# Patient Record
Sex: Female | Born: 1935 | Race: White | Hispanic: No | Smoking: Never smoker
Health system: Southern US, Community
[De-identification: ages and names within clinical notes are randomized; demographics above are authoritative.]

## PROBLEM LIST (undated history)

## (undated) DIAGNOSIS — I10 Essential (primary) hypertension: Secondary | ICD-10-CM

## (undated) DIAGNOSIS — I82409 Acute embolism and thrombosis of unspecified deep veins of unspecified lower extremity: Secondary | ICD-10-CM

---

## 2007-08-16 ENCOUNTER — Encounter: Admission: RE | Admit: 2007-08-16 | Discharge: 2007-08-16 | Payer: Self-pay | Admitting: Geriatric Medicine

## 2007-12-17 ENCOUNTER — Inpatient Hospital Stay (HOSPITAL_COMMUNITY): Admission: EM | Admit: 2007-12-17 | Discharge: 2007-12-24 | Payer: Self-pay | Admitting: Emergency Medicine

## 2008-01-09 ENCOUNTER — Ambulatory Visit: Admission: RE | Admit: 2008-01-09 | Discharge: 2008-01-09 | Payer: Self-pay | Admitting: Gynecology

## 2008-04-16 ENCOUNTER — Ambulatory Visit (HOSPITAL_COMMUNITY): Admission: RE | Admit: 2008-04-16 | Discharge: 2008-04-16 | Payer: Self-pay | Admitting: Gynecology

## 2008-04-28 ENCOUNTER — Encounter: Admission: RE | Admit: 2008-04-28 | Discharge: 2008-04-28 | Payer: Self-pay | Admitting: Geriatric Medicine

## 2008-05-09 ENCOUNTER — Encounter: Admission: RE | Admit: 2008-05-09 | Discharge: 2008-05-09 | Payer: Self-pay | Admitting: Geriatric Medicine

## 2008-10-17 ENCOUNTER — Encounter: Admission: RE | Admit: 2008-10-17 | Discharge: 2008-10-17 | Payer: Self-pay | Admitting: Geriatric Medicine

## 2008-10-21 ENCOUNTER — Encounter: Admission: RE | Admit: 2008-10-21 | Discharge: 2008-10-21 | Payer: Self-pay | Admitting: Geriatric Medicine

## 2008-10-29 ENCOUNTER — Encounter: Admission: RE | Admit: 2008-10-29 | Discharge: 2008-10-29 | Payer: Self-pay | Admitting: Geriatric Medicine

## 2008-12-22 ENCOUNTER — Encounter: Admission: RE | Admit: 2008-12-22 | Discharge: 2008-12-22 | Payer: Self-pay | Admitting: Geriatric Medicine

## 2009-03-18 ENCOUNTER — Encounter: Admission: RE | Admit: 2009-03-18 | Discharge: 2009-03-18 | Payer: Self-pay | Admitting: Geriatric Medicine

## 2009-04-29 ENCOUNTER — Encounter: Admission: RE | Admit: 2009-04-29 | Discharge: 2009-04-29 | Payer: Self-pay | Admitting: Geriatric Medicine

## 2009-05-07 IMAGING — CT CT PELVIS W/ CM
2 of 5 series · 16 of 46 positions shown, 18 images · IV contrast (APPLIED)
Comparison: none

CLINICAL DATA: Left lower quadrant pain and left groin pain.
 ABDOMEN CT WITH CONTRAST ? 12/17/07:
TECHNIQUE: Multidetector CT imaging of the abdomen was performed following the standard protocol during bolus administration of intravenous contrast. 
 Contrast: 100 cc Omnipaque 300 IV. 
 There is no prior study for comparison.
TECHNIQUE: Multidetector CT imaging of the pelvis was performed following the standard protocol during bolus administration of intravenous contrast.

[Series 2: abd/pelv with 5.0 b31f st · axial · 0.84mm/px · z∈[-296,+84]mm · 13 of 86 slices shown, 15 images]
[im 5/86  soft-tissue]
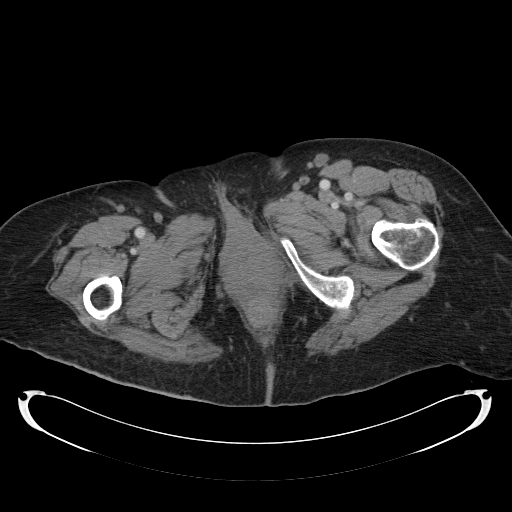
[im 5/86  bone]
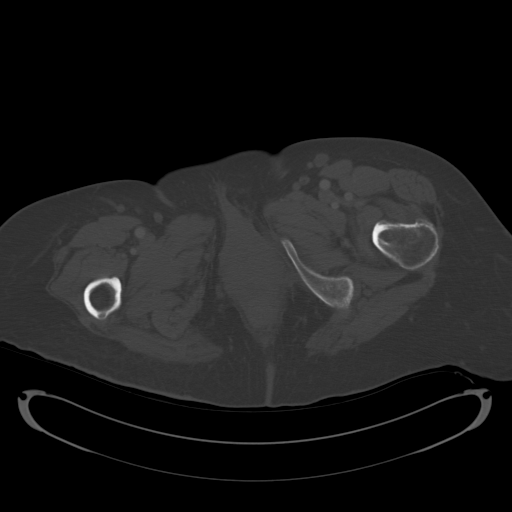
[im 14/86  soft-tissue]
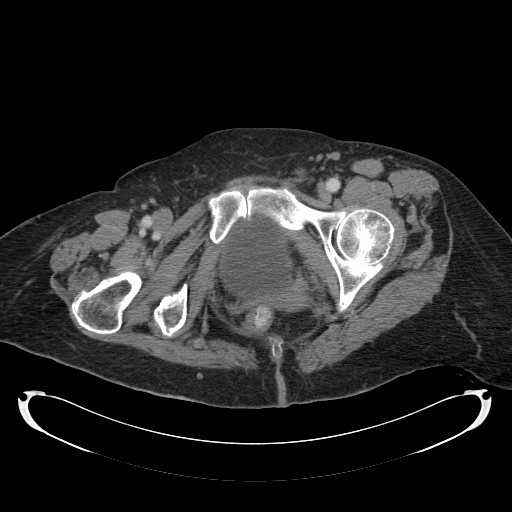
[im 18/86  soft-tissue]
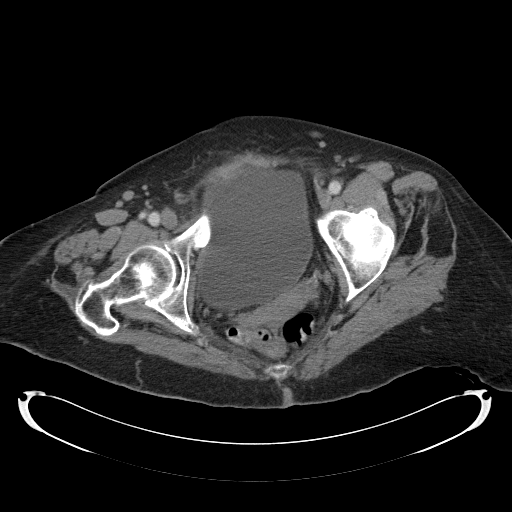
[im 23/86  soft-tissue]
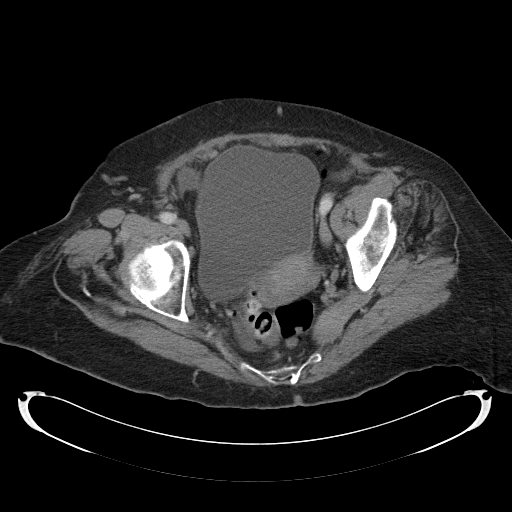
[im 32/86  soft-tissue]
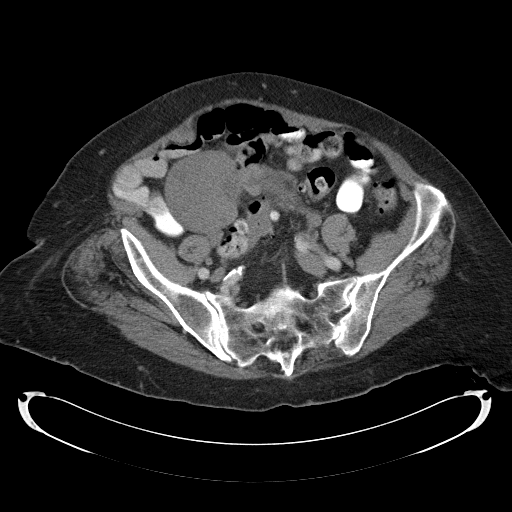
[im 36/86  soft-tissue]
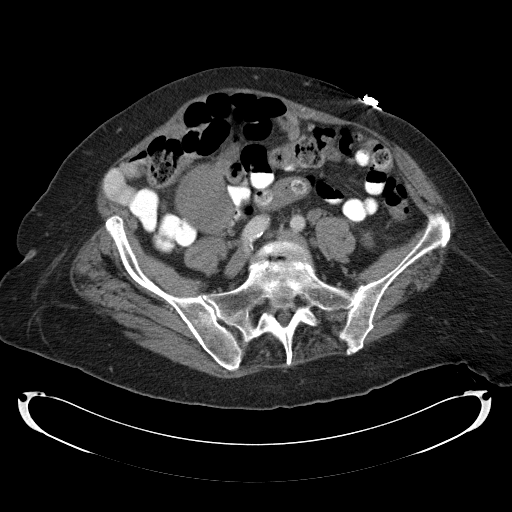
[im 45/86  soft-tissue]
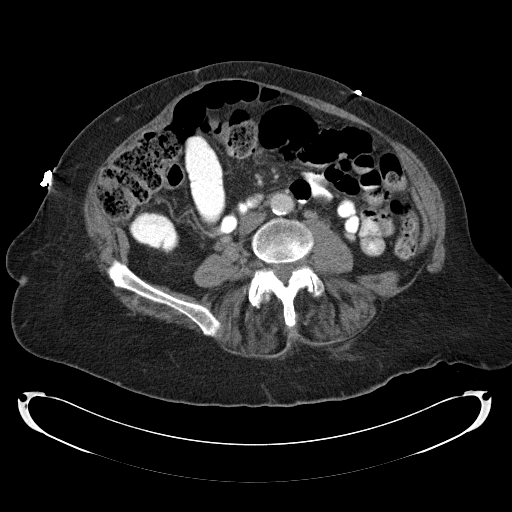
[im 50/86  soft-tissue]
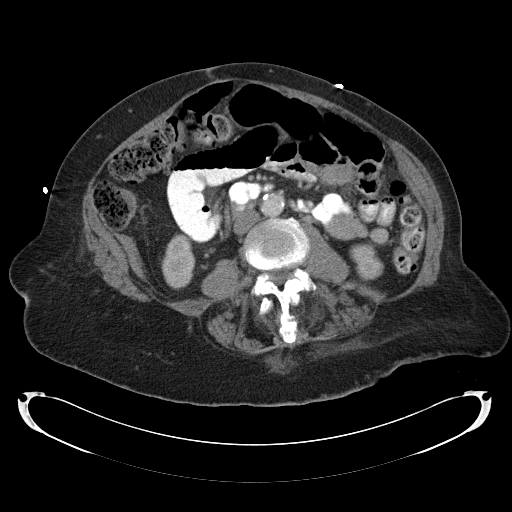
[im 54/86  soft-tissue]
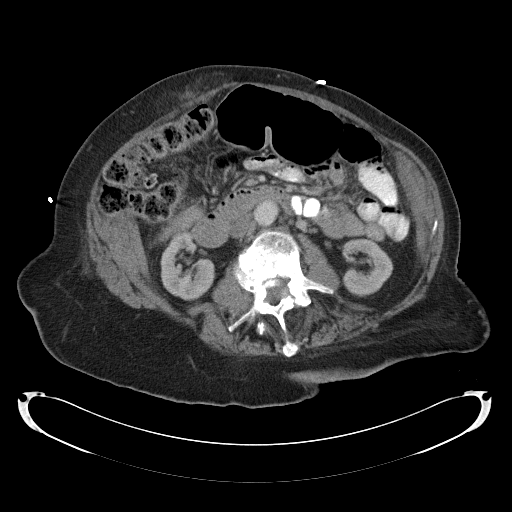
[im 54/86  bone]
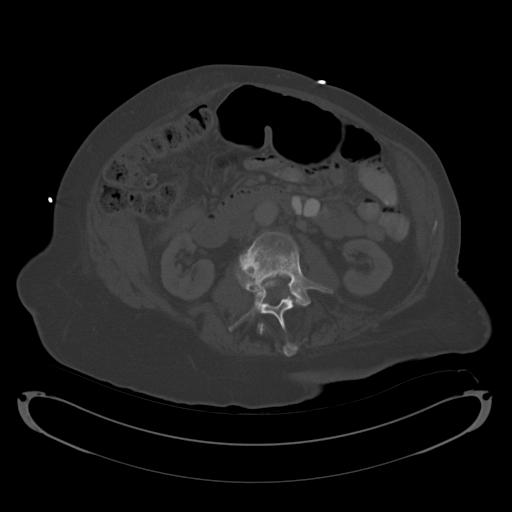
[im 63/86  soft-tissue]
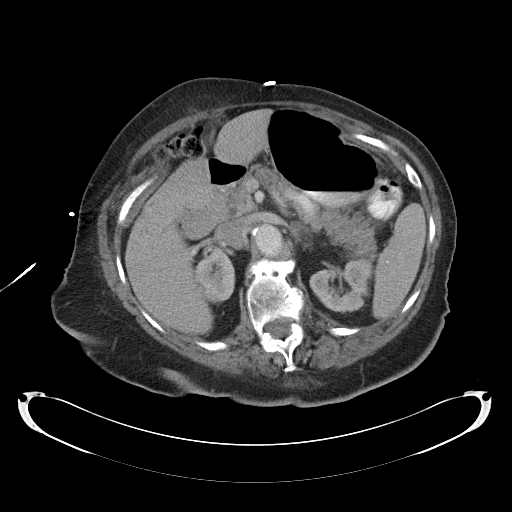
[im 68/86  soft-tissue]
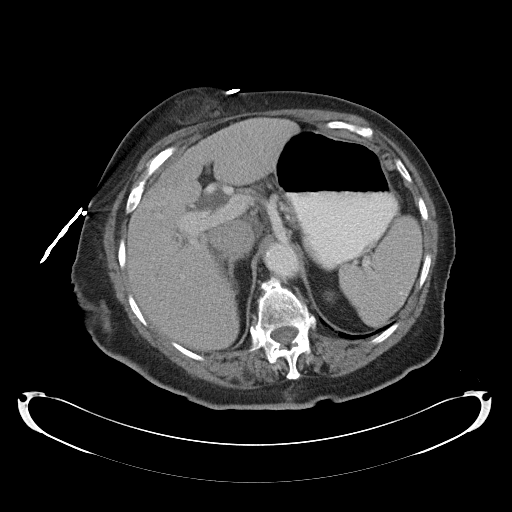
[im 72/86  soft-tissue]
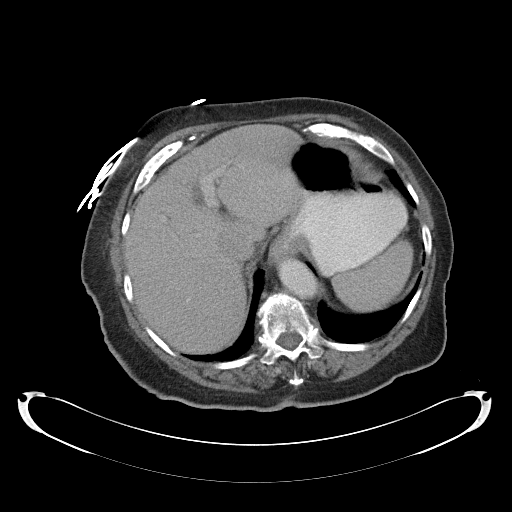
[im 81/86  soft-tissue]
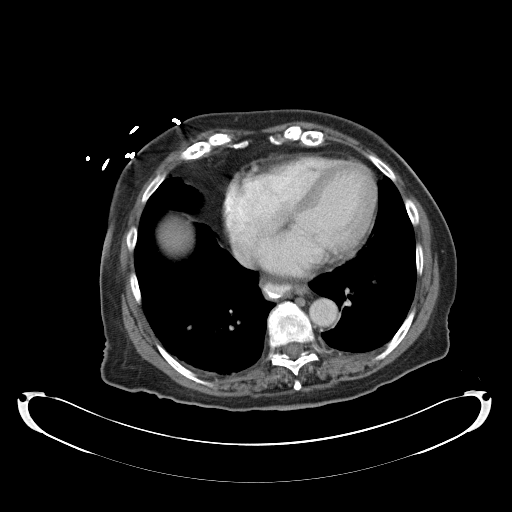

[Series 602: coronal · coronal · 0.84mm/px · 3 of 69 slices shown]
[im 23/69  soft-tissue]
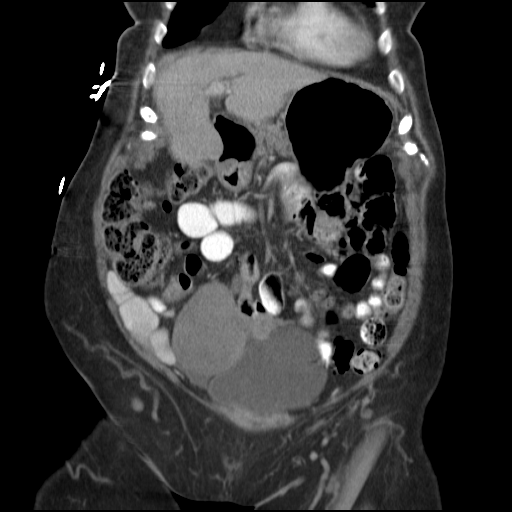
[im 31/69  soft-tissue]
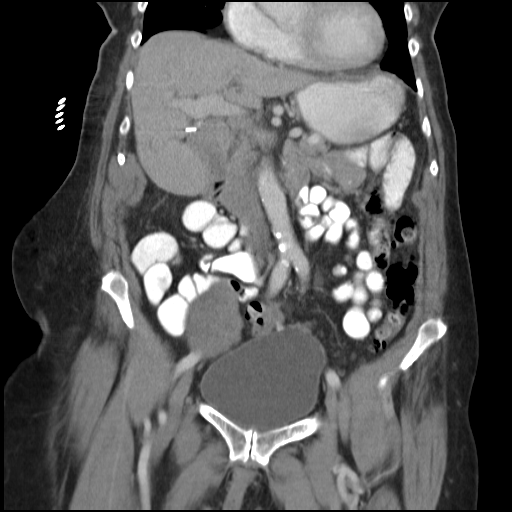
[im 38/69  soft-tissue]
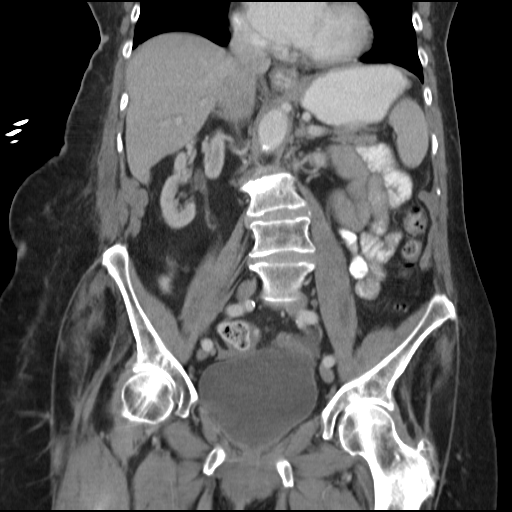

[16 of 46 positions shown; findings below may reference images not displayed]

FINDINGS: The visualized lung bases are unremarkable.  A small amount of right basilar atelectasis is noted.  A small hiatal hernia is noted.  
 The patient is status post cholecystectomy.  The enhanced liver and spleen are unremarkable.  There is a low-density cystic lesion in the head of the pancreas.  It measures 1.9 cm.  Although this may represent a pancreatic pseudocyst, a cystic neoplasm cannot be excluded.  Further evaluation is recommended.  
 Degenerative changes of the lumbar spine are seen.  The kidneys show symmetrical size and enhancement without evidence of focal mass.  There is no bowel obstruction.  There is no ascites or free air.  There is no adenopathy.  
 Mild intrahepatic biliary ductal dilatation is most likely a post-cholecystectomy state.  
 There is a right anterior small abdominal wall ventral hernia containing only fat measuring 4.7 x 1.8 cm without evidence of acute complication.  
 There is minimal posterior subluxation of L3 on L4 with moderate stenosis of the spinal canal at this level.
IMPRESSION: 1.  Status post cholecystectomy.
 2.  There is a low-density cystic lesion within the head of the pancreas measuring 1.9 cm.  Although it may represent a pancreatic pseudocyst, a cystic neoplasm cannot be excluded.  Further evaluation is recommended.
 3.  There is no bowel obstruction. There is no ascites or free air.
 4.  Degenerative changes of the lumbar spine are seen.
 PELVIS CT WITH CONTRAST ? 12/17/07:
FINDINGS: In the right anterior pelvis, there is a multilobulated mass which measures 7.5 x 7.7 cm.  Apparently this is arising from the right ovary.  It is highly suspicious for a neoplasm.  Further evaluation is recommended.  The urinary bladder is unremarkable.  
 The uterus is atrophic.  A trace amount of pelvic free fluid is noted. There is no pelvic adenopathy.  Multiple sigmoid colon diverticula are seen without evidence of acute diverticulitis.
IMPRESSION: 1.  There is a solid multilobulated mass apparently arising from the right ovary measuring 7.5 x 7.7 cm.  This is highly suspicious for a right ovarian neoplasm.  Further evaluation is recommended.  
 2.  There is a trace amount of pelvic free fluid.  Unremarkable urinary bladder.

## 2009-05-20 ENCOUNTER — Encounter: Admission: RE | Admit: 2009-05-20 | Discharge: 2009-05-20 | Payer: Self-pay | Admitting: Geriatric Medicine

## 2009-09-21 ENCOUNTER — Encounter: Admission: RE | Admit: 2009-09-21 | Discharge: 2009-09-21 | Payer: Self-pay | Admitting: Geriatric Medicine

## 2009-11-04 ENCOUNTER — Encounter: Admission: RE | Admit: 2009-11-04 | Discharge: 2009-11-04 | Payer: Self-pay | Admitting: Geriatric Medicine

## 2010-06-30 ENCOUNTER — Encounter: Admission: RE | Admit: 2010-06-30 | Discharge: 2010-06-30 | Payer: Self-pay | Admitting: Geriatric Medicine

## 2010-07-21 ENCOUNTER — Encounter: Admission: RE | Admit: 2010-07-21 | Discharge: 2010-07-21 | Payer: Self-pay | Admitting: Geriatric Medicine

## 2010-07-29 ENCOUNTER — Encounter: Admission: RE | Admit: 2010-07-29 | Discharge: 2010-07-29 | Payer: Self-pay | Admitting: Geriatric Medicine

## 2010-11-01 ENCOUNTER — Encounter: Payer: Self-pay | Admitting: Geriatric Medicine

## 2010-11-13 ENCOUNTER — Encounter: Payer: Self-pay | Admitting: Geriatric Medicine

## 2010-11-19 ENCOUNTER — Other Ambulatory Visit: Payer: Self-pay | Admitting: Geriatric Medicine

## 2010-11-19 DIAGNOSIS — Z1231 Encounter for screening mammogram for malignant neoplasm of breast: Secondary | ICD-10-CM

## 2010-12-07 ENCOUNTER — Ambulatory Visit
Admission: RE | Admit: 2010-12-07 | Discharge: 2010-12-07 | Disposition: A | Discharge status: Home / Self Care | Payer: Medicare Other | Source: Ambulatory Visit | Attending: Geriatric Medicine | Admitting: Geriatric Medicine

## 2010-12-07 DIAGNOSIS — Z1231 Encounter for screening mammogram for malignant neoplasm of breast: Secondary | ICD-10-CM

## 2011-02-22 NOTE — Consult Note (Signed)
NAMEMALYAH, OHLRICH                  ACCOUNT NO.:  [id number]   MEDICAL RECORD NO.:  [id number]          PATIENT TYPE:  INP   LOCATION:  5128                         FACILITY:  [facility]   PHYSICIAN:  Olene Craven, M.D.DATE OF BIRTH:  26-Feb-1923   DATE OF CONSULTATION:  DATE OF DISCHARGE:                                 CONSULTATION   REQUESTING PHYSICIAN:  Michiel Cowboy, M.D.   PRIMARY MD:  Hal T. Stoneking, M.D.   REASON FOR CONSULTATION:  Recommendations on pain control.   RECOMMENDATIONS:  1. Abdominal pain management:  Agree with the start of the morphine ER      50 mg p.o. b.i.d.  Additionally add morphine IR 50 mg p.o. q.4h.      p.r.n. for breakthrough pain.  Over the weekend, can use morphine      sulfate IV 1-2 mg q.1-2h. as needed.  We can adjust the medications      as needed as an outpatient as she returns to the skilled nursing      facility through a palliative care consults, if ordered.  2. Ovarian mass, likely causing her abdominal pain:  We will need      obvious workup for diagnosis.  OB/GYN outpatient referral has been      made.  We may need to have oncology if it is a cancerous lesion.      Once the diagnosis is made, disposition can better be made.  We can      be available for either continued palliative care consult versus      hospice if the patient's lesion turns out to be cancerous, and she      is to forgo treatment options.  3. Recommend at this time goals of care with the patient once better      understanding of her pelvic mass is diagnosed.   IMPRESSION:  1. Ovarian mass with a right 7.5 cm x 7.7 cm mass.  Outpatient workup      pending.  2. Pancreatic mass, likely pseudocyst.  Follow up as an outpatient.  3. Hypertension.   HISTORY OF PRESENT ILLNESS:  This is an 77 year old female who was  admitted earlier this week with a several-day history of lower abdominal  pain.  She was admitted and on further workup diagnosed a 7.5 x 7.7 cm  right ovarian mass.  She has an outpatient workup scheduled for this.  She was started on p.o. morphine after several days of IV morphine.  She  currently reports her pain as a level I.  She is denying any  constitutional symptoms at this time, any fevers or chills.  She has no  nausea or vomiting with this.  She has mild right lower quadrant  abdominal pain with deep palpation.  She denies any shortness of breath,  chest pain, dysphagia, any diarrhea, constipation, or vaginal discharge  with it.  No lower extremity edema with it.   REVIEW OF SYSTEMS:  Pertinent for right lower abdominal discomfort.  See  above for further review of systems.  Negative review of systems:  Negative constitutional.  No fevers or chills.  No dysphagia.  No  hearing or vision changes.  No cough.  No shortness of breath.  No chest  pain.  Mild right lower quadrant abdominal pain.  GU:  No dysuria.  No  vaginal discharge.  No nausea, vomiting, diarrhea.  No lower extremity  weakness.   PAST MEDICAL HISTORY:  Significant for hypertension.   SOCIAL HISTORY:  She is a widow with two children.  One passed away.  No  alcohol or drug use.   FAMILY HISTORY:  Negative for cancer.  Positive coronary artery disease.  She has no known drug allergies.   Her MAR was reviewed.   LABORATORY:  Patient's CT scan demonstrated a right ovarian mass, 7.5 cm  x 7.7 cm with minimal pelvic fluid.  Sodium today is 133, potassium 4.2,  glucose 100, BUN 11, chloride 102, bicarb 23.  Creatinine was 0.7.  She  had a hemoglobin of 11.2, platelets 307, white count 8.9.  Her CA-19-9  was 19.4.  CEA was 2.2.  CA-125 was 16.7.   PHYSICAL EXAMINATION:  Patient was sitting up in a chair with minimal  distress.  VITALS:  Stable.  Blood pressure 132/71, pulse 74.  HEENT:  Unremarkable.  Pupils are equal and reactive to light.  Extraocular movements are intact.  Oropharynx is clear without any  evidence of thrush.  NECK:  Supple.  No  JVD.  LUNGS:  Clear to auscultation.  HEART:  Regular rate and rhythm without murmur, rub or gallop.  ABDOMEN:  Slightly obese.  Soft.  Positive bowel sounds.  Positive right  lower quadrant abdominal discomfort with deep palpation.  Mild left  lower quadrant abdominal pain with discomfort as well.  GU/RECTAL:  Deferred.  No lower extremity clubbing, cyanosis or edema.   IMPRESSION/PLAN:  This is an 77 year old female who was admitted for  abdominal pain.  Under the workup, it was discovered she had a large  right ovarian mass.  At this time, an outpatient workup is to be  performed.  We have been consulted for recommendations and pain  management.  She was started on morphine ER last evening at 50 mg p.o.  b.i.d.  Today, she is reporting minimum discomfort.  This may be a good  dose to start with.  Would add MSIR 50 mg p.o. q.4h. p.r.n. for  breakthrough pain and monitor her as an outpatient and make changes and  adjustments as necessary.  Obviously, she will need a workup for her  ovarian mass.  In the event this is a cancerous lesion, oncology will  need to be consulted and further goals of care discussed in terms of a  workup.  We can be available for palliative care and hospice referrals  if indicated after the initial workup is done.   Thank you for the consult.  Approximately 90 minutes were spent with the  patient in terms of examination and coordination of care.      Olene Craven, M.D.  Electronically Signed     DEH/MEDQ  D:  12/21/2007  T:  12/21/2007  Job:  [id number]

## 2011-02-22 NOTE — Discharge Summary (Signed)
NAMEPAUL, Pam Wheeler                  ACCOUNT NO.:  [id number]   MEDICAL RECORD NO.:  [id number]          PATIENT TYPE:  INP   LOCATION:  5128                         FACILITY:  [facility]   PHYSICIAN:  Michiel Cowboy, MDDATE OF BIRTH:  Oct 14, 1922   DATE OF ADMISSION:  12/17/2007  DATE OF DISCHARGE:  12/22/2007                               DISCHARGE SUMMARY   DISCHARGE DIAGNOSES:  1. Pelvic mass per MRI, possibly fibroids.  2. Pancreatic cyst per MRI, likely benign.  3. Dementia.  4. Hypertension.  5. Hyponatremia.  6. Chronic back pain secondary to degenerative disk disease.  7. Left shoulder pain secondary to likely arthritis.  8. Constipation.  9. Rotator cuff impingement.   CONSULTATIONS:  1. Palliative care.  2. The case was discussed with OB/GYN.   FILMS:  CT of abdomen done on December 17, 2007 showing solid multilobulated  mass possibly arising from the right ovary, measuring 7.5 to 7.7 cm with  trace mild pelvic fluid.   KUB of the abdomen shows moderate stool in the colon without evidence of  obstruction.  MR of the abdomen done on December 19, 2007, reviewed by the  radiologist, a 1.6 cystic lesion of pancreatic head with communication  with common duct and pelvic mass which per radiology, seems similar  density to fibroids and uterus and likely represents a fibroid.  Also,  hiatal hernia noted.   Thoracic spine and lumbar spine films done on December 20, 2007 showing  diffuse spondylosis and right upper lobe with thoracic and lumbar  scoliosis.  No fractures noted.   A shoulder film done on December 21, 2007 showing degenerative joint  disease, worrisome for rotator cuff impingement.   HOSPITAL COURSE:  Please see full summary, but briefly, this is an 77-  year-old female with a history of hypertension, who presented with  diffuse pain and abdominal pain.  X-ray obtained showed a soft mass.  She had a follow-up CT scan of the abdomen that actually showed a  possible  pelvic mass worrisome for malignancy.  An MRI of the abdomen  was performed which per review of radiology showed more consistent with  possible fibroid rather than malignancy.  Patient also noted to have  pancreatic cyst, which was also felt to be likely benign.  The patient  has had cancer markers performed while in-house, all of which were  unremarkable, except for slightly elevated beta HCG of 5.  Her cancer  antigen 125 was 16.7.  Her cancer antigen 19.9 was 19.4.  Her CEA was  2.2.   Her case was discussed with OB/GYN, and she is to follow up with  oncology on April 1 at 11:30 a.m. for further evaluation but most  likely, her disease is benign.  This was discussed with family.  The  only definitive way to approach this is likely an intraoperative biopsy,  which the patient's family are against at this point, given the history  of dementia and advanced age.   Hyponatremia:  The patient was noted to be somewhat hyponatremic on  admission.  The patient was  given IV fluids, which improved  hyponatremia.  Prior to discharge, her sodium is up to 133.  She is  asymptomatic.  This was most likely secondary to dehydration secondary  to poor p.o. intake.  The patient is encouraged to take appropriate p.o.  when she is discharged.  Patient is to be discharged to [facility]  under the care of Dr. Pete Glatter.  Patient is to follow up with OB/GYN  for intra-abdominal/pelvic mass.  Patient is to have repeat imaging of  her pancreas in about six months.  MRI would probably be the most  appropriate at that time.   DISCHARGE MEDICATIONS:  1. Colace 100 mg p.o. b.i.d.  2. Avapro 150 mg p.o. daily.  3. Lopressor 25 mg p.o. b.i.d.  4. MS Contin 50 mg p.o. b.i.d. for pain but make it scheduled.  5. Multivitamins.  6. Ditropan 5 mg p.o. daily.  7. MiraLax 17 gm p.o. b.i.d. as needed for constipation.  8. Senna 1 tablet p.o. nightly.  9. Zocor 20 mg p.o. daily.  10.Morphine sulfate 50 mg p.o.  q.4h. p.r.n. breakthrough pain.   In relation to back pain, lumbar films were obtained that showed no  fractures or metastatic disease.  Palliative care consult was called.  Patient was started on MS Contin, given longstanding history of pain.  This can be further adjusted as an outpatient at the nursing facility.  Patient can also have a palliative care followup at the nursing facility  as well for continuation of pain management there.   Patient is to be discharged on MS Contin twice daily, 50 mg, as well as  p.r.n.  Morphine sulfate q.4h. as needed.  Patient is to have this held  for sedation.   Constipation:  Patient was started on an aggressive bowel regimen here.  Will continue that in the nursing facility.   Hypertension:  Continued home meds while in-house.   Left shoulder pain:  A shoulder film showed questionable impingement of  rotator cuff.  Patient is to have physical therapy follow up as an  outpatient with nursing facility.  The patient is to have joint  exercises and will likely benefit from further rehabilitation but  orthopedics as an outpatient in the future.  Please arrange for that as  an outpatient.   FOLLOW-UP LABS:  Patient will likely need to have repeat sodium level  once she is discharged to her nursing facility to make sure that her  hyponatremia has completely resolved, and the patient is doing well on  longstanding p.o. intake only and does not end up being dehydrated  again.  Would encourage p.o. fluids as an outpatient.     Michiel Cowboy, MD  Electronically Signed    AVD/MEDQ  D:  12/21/2007  T:  12/21/2007  Job:  [id number]   cc:   Hal T. Stoneking, M.D.

## 2011-02-22 NOTE — H&P (Signed)
NAMEMABELLE, Pam Wheeler                  ACCOUNT NO.:  [id number]   MEDICAL RECORD NO.:  [id number]          PATIENT TYPE:  EMS   LOCATION:  MAJO                         FACILITY:  [facility]   PHYSICIAN:  Hollice Espy, M.D.DATE OF BIRTH:  09/02/1923   DATE OF ADMISSION:  12/17/2007  DATE OF DISCHARGE:                              HISTORY & PHYSICAL   This is a continuation.  Please see previous partial H&P that was  dictated but, for some reason, was cut off.  Continuation is as follows.   ALLERGIES:  No known drug allergies.   SOCIAL HISTORY:  She denies any tobacco, alcohol or drug use.   FAMILY HISTORY:  Positive for CAD, negative for cancer.   PHYSICAL EXAMINATION:  VITAL SIGNS:  Temperature 97.4, heart rate 67,  blood pressure 164/86, respirations 18, O2 saturation 97% on room air.  GENERAL APPEARANCE:  She is alert and oriented x3, no apparent distress.  HEENT:  Normocephalic and atraumatic.  Moist mucous membranes.  NECK:  No carotid bruits.  LUNGS:  Clear to auscultation bilaterally.  CARDIOVASCULAR:  Regular rate and rhythm, S1 and S2.  ABDOMEN:  Soft with some generalized abdominal soreness over nonspecific  tenderness.  Decreased bowel sounds.  EXTREMITIES:  No clubbing or cyanosis.  She has about a 1+ pitting edema  from the knees down.   LABORATORY DATA:  Abdominal CT is as per HPI.   White count 7.3, hemoglobin 11.5, hematocrit 34, MCV 86, platelet count  307, no shift.  Sodium 139, potassium 3.9, chloride 97, bicarb 25, BUN  9, creatinine 0.79, glucose 94.  LFTs are notable for albumin of 3.1.  UA is negative.   ASSESSMENT/PLAN:  1. Abdominal pain with ovarian mass, possible cancer, seen on CT.      Will ask interventional radiology for biopsy and order routine labs      and evaluation of ovarian cancer including CA-125, CEA, LDH, alpha      fetoprotein,      inhibin and Beta HCG.  2. Hypertension.  Continue medications.  3. Hyponatremia.  Based on records,  patient does have a previous      medical history.  Will continue to observe.      Hollice Espy, M.D.  Electronically Signed     SKK/MEDQ  D:  12/17/2007  T:  12/17/2007  Job:  [id number]   cc:   Hal T. Stoneking, M.D.

## 2011-02-22 NOTE — H&P (Signed)
NAMEVALERIA, Wheeler                  ACCOUNT NO.:  [id number]   MEDICAL RECORD NO.:  [id number]          PATIENT TYPE:   LOCATION:                                 FACILITY:   PHYSICIAN:  Hollice Espy, M.D.DATE OF BIRTH:  [birth year]   DATE OF ADMISSION:  12/17/2007  DATE OF DISCHARGE:                              HISTORY & PHYSICAL   PRIMARY CARE PHYSICIAN:  Hal T. Stoneking, M.D.   CHIEF COMPLAINT:  Abdominal pain.   HISTORY OF PRESENT ILLNESS:  Please note that the patient is a very poor  historian and seems to be easily upset about her diagnosis possibility,  so she is not able to give me much of a Review of Systems and History.  Through medical records as well as documentation, the patient is an 77-  year-old white female with past medical history of hypertension who  started having abdominal pain in her left lower quadrant for at least  the past 1-2 days.  She has been still able to eat and has continued to  have regular bowel movements.  She says she has not had much of an  appetite, and she has had continued abdominal pain that has been not  very well treated with pain medication.  She was sent over from [facility] where upon evaluation she had normal labs.  However, abdominal and  pelvic CT showed evidence of a right ovarian mass concerning for cancer.  [facility] were called for further assessment and evaluation.  The patient herself appears to be quite upset.  She, in general,  actually denies basically any other complaints.  She denies any  headaches, vision changes.  No dysphagia, no chest pain, palpitations,  shortness of breath.  When asked about her abdominal pain, she does  complain of some she says soreness but only when prompted.  Her Review  of Systems is otherwise negative.   PAST MEDICAL HISTORY:  Includes hypertension.   MEDICATIONS:  1. Mevacor 40.  2. Micardis 40.  3. Ditropan XL 5.  4. Lopressor 25 p.o. b.i.d.  5. Multivitamin p.o.  daily.  6. Os-Cal with D 600 b.i.d.  7. Actonel 35 p.o. weekly.  8. Vicodin p.r.n.  9. Tylenol p.r.n.   DICTATION STOPS HERE.      Hollice Espy, M.D.  Electronically Signed    SKK/MEDQ  D:  12/17/2007  T:  12/17/2007  Job:  [id number]

## 2011-02-22 NOTE — Consult Note (Signed)
NAME:  Pam Wheeler, Pam Wheeler                  ACCOUNT NO.:  [id number]   MEDICAL RECORD NO.:  [id number]          PATIENT TYPE:  OUT   LOCATION:  GYN                          FACILITY:  [facility]   PHYSICIAN:  De Blanch, M.D.DATE OF BIRTH:  [birth year]   DATE OF CONSULTATION:  01/09/2008  DATE OF DISCHARGE:                                 CONSULTATION   CHIEF COMPLAINT:  An 77 year old white female seen in consultation by  Dr. Pete Glatter regarding management of a solid adnexal mass.  This mass  was apparently diagnosed during a recent hospitalization when the  patient presented with diffuse abdominal pain.  As part of that workup,  an MRI was obtained of the pelvis showing a 7.5 x 7.7 cm solid mass  arising from the right ovary.  There was no evidence of pelvic  adenopathy.  She does have multiple sigmoid colon diverticula, but no  evidence of acute diverticulitis.  There is trace amount of free pelvic  fluid.  Tumor markers have been obtained.  The CA-125 is 16, CA19-9 is  19.4 and CEA is 2.2.  Her inhibin A inhibin is 6.1 (elevated).  The  patient herself reports that she has no symptoms at the present time.  Her abdominal pain has resolved.   CURRENT MEDICATIONS:  1. Colace.  2. Avapro.  3. Lopressor.  4. MS Contin 50 mg b.i.d.  5. Multivitamins.  6. Ditropan.  7. MiraLax.  8. Senna.  9. Zocor.  10.Morphine sulfate p.r.n. breakthrough pain.   PAST MEDICAL HISTORY/ILLNESSES:  1. Dementia.  2. Hypertension.  3. Hyponatremia.  4. Chronic back pain due to degenerative disk.  5. Left shoulder pain secondary to osteoarthritis.  6. Constipation.  7. Rotator cuff impingement.   FAMILY HISTORY:  Negative for gynecologic, breast or colon cancer.   REVIEW OF SYSTEMS:  A 10-point comprehensive review of systems negative  except as noted above.   SOCIAL HISTORY:  The patient lives at the [facility].   PHYSICAL EXAMINATION:  VITAL SIGNS:  Height 5 feet five inches.  The  patient  is unable to stand to be weighed.  GENERAL:  The patient is an elderly white female who has difficulty  getting on and off the table and is short of breath.  ABDOMEN:  Soft, nontender.  No mass, organomegaly, ascites or hernias  noted.  PELVIC:  EGBUS, vagina, bladder and urethra are normal.  Cervix is  atrophic.  Uterus is difficult to outline because of the patient's  habitus.  No ascites or masses are noted.   IMPRESSION:  Solid pelvic mass with slightly elevated inhibin, all of  the tumor markers are negative.  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  Given her overall poor medical  condition, I believe she is not a surgical candidate.  Therefore would  recommend following this mass as granulosa cell tumors are oftentimes  slowly growing.  Additionally in the differential diagnosis,  fibrothecomas and other fibroid tumors of the ovary are possibilities  which would be even more slowly growing and given the  patient's lack of  symptoms would be observed.  We would suggest she have a repeat  ultrasound of the mass in approximately 3 months.      De Blanch, M.D.  Electronically Signed     DC/MEDQ  D:  01/09/2008  T:  01/09/2008  Job:  [id number]   cc:   Hal T. Stoneking, M.D.  Fax: [phone]   Telford Nab, R.N.  [address]  [place], [place] [address]

## 2011-02-25 NOTE — Discharge Summary (Signed)
Pam Wheeler, Pam Wheeler                  ACCOUNT NO.:  [id number]   MEDICAL RECORD NO.:  [id number]          PATIENT TYPE:  INP   LOCATION:  5128                         FACILITY:  [facility]   PHYSICIAN:  Michiel Cowboy, MDDATE OF BIRTH:  1922-10-18   DATE OF ADMISSION:  12/17/2007  DATE OF DISCHARGE:  12/24/2007                               DISCHARGE SUMMARY   ADDENDUM   Please see entire discharge summary from December 22, 2007.   The patient had to stay an additional 2 days secondary to hyponatremia.  The patient initially was given IV fluids and then was fluid  resuscitated.  The patient continued to have hyponatremia.  Once she was  slowed up, effort was made, but she has SIADH and she was actually given  fluid restriction.  Sodium continued to fluctuate and as per discussion  with Dr. Pete Glatter, who is her primary care Ramon Brant, he thought  comfortable to follow this up as an outpatient at the nursing facility.  At the time of discharge, her sodium was 127 and she was asymptomatic  from this.  Of note, her TSH was checked while in the hospital and it  was 4.566.  Her cortisol level was checked and that was 9.3.  The  patient was discharged back to a nursing facility  under Dr. Laverle Hobby  care on December 24, 2007.  Please see the prior discharge summary for list  of her medications.      Michiel Cowboy, MD  Electronically Signed     AVD/MEDQ  D:  01/22/2008  T:  01/22/2008  Job:  [id number]   cc:   Hal T. Stoneking, M.D.

## 2011-07-04 LAB — CBC
HCT: 32.3 — ABNORMAL LOW
HCT: 34.4 — ABNORMAL LOW
Hemoglobin: 10.8 — ABNORMAL LOW
Hemoglobin: 11 — ABNORMAL LOW
Hemoglobin: 11.2 — ABNORMAL LOW
Hemoglobin: 11.8 — ABNORMAL LOW
MCHC: 33.7
MCHC: 34.1
MCHC: 34.2
MCV: 86.4
MCV: 87.1
MCV: 87.4
MCV: 86.6
Platelets: 307
Platelets: 354
Platelets: 313
RBC: 3.67 — ABNORMAL LOW
RBC: 3.73 — ABNORMAL LOW
RBC: 3.82 — ABNORMAL LOW
RBC: 4.46
RDW: 12.7
RDW: 13
RDW: 12.6
RDW: 13
WBC: 10.8 — ABNORMAL HIGH
WBC: 7.8
WBC: 8.9

## 2011-07-04 LAB — COMPREHENSIVE METABOLIC PANEL
ALT: 13
ALT: 15
AST: 17
AST: 22
Alkaline Phosphatase: 45
CO2: 25
CO2: 25
Calcium: 8.4
Chloride: 99
Creatinine, Ser: 0.79
GFR calc Af Amer: >60
GFR calc Af Amer: >60
GFR calc non Af Amer: 56 — ABNORMAL LOW
GFR calc non Af Amer: >60
Potassium: 4.3
Sodium: 128 — ABNORMAL LOW
Sodium: 129 — ABNORMAL LOW
Total Bilirubin: 0.6
Total Protein: 6

## 2011-07-04 LAB — BASIC METABOLIC PANEL
BUN: 11
BUN: 20
CO2: 20
CO2: 24
CO2: 24
Calcium: 8.2 — ABNORMAL LOW
Chloride: 100
Chloride: 99
Creatinine, Ser: 0.72
Creatinine, Ser: 0.88
Creatinine, Ser: 0.96
GFR calc Af Amer: >60
GFR calc Af Amer: >60
GFR calc non Af Amer: 56 — ABNORMAL LOW
GFR calc non Af Amer: 56 — ABNORMAL LOW
Glucose, Bld: 103 — ABNORMAL HIGH
Glucose, Bld: 89
Glucose, Bld: 113 — ABNORMAL HIGH
Glucose, Bld: 96
Potassium: 4.2
Potassium: 4.1
Potassium: 4.6
Sodium: 131 — ABNORMAL LOW
Sodium: 128 — ABNORMAL LOW

## 2011-07-04 LAB — DIFFERENTIAL
Eosinophils Absolute: 0.2
Eosinophils Relative: 3
Lymphocytes Relative: 13
Lymphs Abs: 1
Monocytes Relative: 8

## 2011-07-04 LAB — LACTATE DEHYDROGENASE: LDH: 168

## 2011-07-04 LAB — URINALYSIS, ROUTINE W REFLEX MICROSCOPIC
Bilirubin Urine: NEGATIVE
Hgb urine dipstick: NEGATIVE
Ketones, ur: NEGATIVE
Nitrite: NEGATIVE
Protein, ur: NEGATIVE
Specific Gravity, Urine: 1.005
Urobilinogen, UA: 0.2

## 2011-07-04 LAB — BASIC METABOLIC PANEL WITH GFR
BUN: 11
CO2: 23
Calcium: 8.1 — ABNORMAL LOW
Chloride: 102
Creatinine, Ser: 0.71
GFR calc non Af Amer: >60
Glucose, Bld: 100 — ABNORMAL HIGH
Potassium: 4.2
Sodium: 133 — ABNORMAL LOW

## 2011-07-04 LAB — OSMOLALITY, URINE: Osmolality, Ur: 229 — ABNORMAL LOW

## 2011-07-04 LAB — SODIUM, URINE, RANDOM: Sodium, Ur: 53

## 2011-07-04 LAB — CANCER ANTIGEN 19-9: CA 19-9: 19.4 — ABNORMAL LOW (ref <35.0)

## 2011-07-04 LAB — CA 125: CA 125: 16.7

## 2011-07-04 LAB — CEA: CEA: 2.2

## 2011-07-04 LAB — HCG, QUANTITATIVE, PREGNANCY: hCG, Beta Chain, Quant, S: 5 — ABNORMAL HIGH

## 2015-09-09 ENCOUNTER — Inpatient Hospital Stay (HOSPITAL_COMMUNITY)
Admission: EM | Admit: 2015-09-09 | Discharge: 2015-09-11 | DRG: 871 | Disposition: A | Discharge status: Skilled Nursing Facility | Payer: Medicare Other | Attending: Internal Medicine | Admitting: Internal Medicine

## 2015-09-09 DIAGNOSIS — D6832 Hemorrhagic disorder due to extrinsic circulating anticoagulants: Secondary | ICD-10-CM | POA: Diagnosis present

## 2015-09-09 DIAGNOSIS — Y95 Nosocomial condition: Secondary | ICD-10-CM | POA: Diagnosis present

## 2015-09-09 DIAGNOSIS — R4182 Altered mental status, unspecified: Secondary | ICD-10-CM | POA: Diagnosis present

## 2015-09-09 DIAGNOSIS — R791 Abnormal coagulation profile: Secondary | ICD-10-CM | POA: Diagnosis present

## 2015-09-09 DIAGNOSIS — F0391 Unspecified dementia with behavioral disturbance: Secondary | ICD-10-CM | POA: Diagnosis present

## 2015-09-09 DIAGNOSIS — I4891 Unspecified atrial fibrillation: Secondary | ICD-10-CM

## 2015-09-09 DIAGNOSIS — N179 Acute kidney failure, unspecified: Secondary | ICD-10-CM | POA: Diagnosis present

## 2015-09-09 DIAGNOSIS — T45515A Adverse effect of anticoagulants, initial encounter: Secondary | ICD-10-CM | POA: Diagnosis present

## 2015-09-09 DIAGNOSIS — K922 Gastrointestinal hemorrhage, unspecified: Secondary | ICD-10-CM | POA: Diagnosis present

## 2015-09-09 DIAGNOSIS — Z515 Encounter for palliative care: Secondary | ICD-10-CM | POA: Diagnosis present

## 2015-09-09 DIAGNOSIS — A419 Sepsis, unspecified organism: Secondary | ICD-10-CM | POA: Diagnosis not present

## 2015-09-09 DIAGNOSIS — R6520 Severe sepsis without septic shock: Secondary | ICD-10-CM

## 2015-09-09 DIAGNOSIS — Z7952 Long term (current) use of systemic steroids: Secondary | ICD-10-CM

## 2015-09-09 DIAGNOSIS — R0600 Dyspnea, unspecified: Secondary | ICD-10-CM | POA: Insufficient documentation

## 2015-09-09 DIAGNOSIS — Z66 Do not resuscitate: Secondary | ICD-10-CM | POA: Diagnosis present

## 2015-09-09 DIAGNOSIS — R6521 Severe sepsis with septic shock: Secondary | ICD-10-CM | POA: Diagnosis present

## 2015-09-09 DIAGNOSIS — E875 Hyperkalemia: Secondary | ICD-10-CM | POA: Diagnosis present

## 2015-09-09 DIAGNOSIS — Z993 Dependence on wheelchair: Secondary | ICD-10-CM

## 2015-09-09 DIAGNOSIS — D62 Acute posthemorrhagic anemia: Secondary | ICD-10-CM | POA: Diagnosis present

## 2015-09-09 DIAGNOSIS — R451 Restlessness and agitation: Secondary | ICD-10-CM | POA: Diagnosis present

## 2015-09-09 DIAGNOSIS — I10 Essential (primary) hypertension: Secondary | ICD-10-CM | POA: Diagnosis present

## 2015-09-09 DIAGNOSIS — J189 Pneumonia, unspecified organism: Secondary | ICD-10-CM | POA: Diagnosis present

## 2015-09-09 HISTORY — DX: Acute embolism and thrombosis of unspecified deep veins of unspecified lower extremity: I82.409

## 2015-09-09 HISTORY — DX: Essential (primary) hypertension: I10

## 2015-09-09 NOTE — ED Provider Notes (Addendum)
CSN: [id number]     Arrival date & time 09/09/15  2344 History  By signing my name below, I, Pam Wheeler, attest that this documentation has been prepared under the direction and in the presence of [name], MD. Electronically Signed: Sonum Wheeler, Neurosurgeoncribe. 09/09/2015. 11:55 PM.    Chief Complaint  Patient presents with  . Altered Mental Status  . Hypotension   The history is provided by the patient and the EMS personnel. No language interpreter was used.     LEVEL 5 Caveat: Altered Mental Status  HPI Comments: Meridee ScoreJean Wheeler is a 81 y.o. female brought in by ambulance, who presents to the Emergency Department complaining of altered mental status that began earlier this evening. Patient has been diagnosed with pneumonia and is being treated with Levaquin. Per EMS, patient was cool and diaphoretic upon their arrival with BP around 60/40 and HR of 160-180's. Patient states she has pain to the heels and elbows. Per family, patient has been altered for the past 2 days and states her Coumadin was stopped due to therapeutic levels.    Past Medical History  Diagnosis Date  . HTN (hypertension)   . DVT (deep venous thrombosis) (HCC)    No past surgical history on file. Family History  Problem Relation Age of Onset  . CAD    . Cancer Neg Hx    Social History  Substance Use Topics  . Smoking status: Never Smoker   . Smokeless tobacco: Not on file  . Alcohol Use: No   OB History    No data available     Review of Systems  Unable to perform ROS: Mental status change    Allergies  Review of patient's allergies indicates no known allergies.  Home Medications   Prior to Admission medications   Medication Sig Start Date End Date Taking? Authorizing Provider  albuterol (PROVENTIL) (2.5 MG/3ML) 0.083% nebulizer solution Take 2.5 mg by nebulization every 6 (six) hours as needed for wheezing or shortness of breath.   Yes Historical Provider, MD  albuterol (PROVENTIL) (2.5 MG/3ML)  0.083% nebulizer solution Take 2.5 mg by nebulization 3 (three) times daily.   Yes Historical Provider, MD  buPROPion (WELLBUTRIN XL) 150 MG 24 hr tablet Take 300 mg by mouth daily.   Yes Historical Provider, MD  chlorpheniramine (CHLOR-TRIMETON) 4 MG tablet Take 4 mg by mouth every 4 (four) hours as needed for allergies.   Yes Historical Provider, MD  divalproex (DEPAKOTE) 125 MG DR tablet Take 250 mg by mouth at bedtime.   Yes Historical Provider, MD  docusate sodium (COLACE) 100 MG capsule Take 100 mg by mouth 2 (two) times daily.   Yes Historical Provider, MD  donepezil (ARICEPT) 10 MG tablet Take 10 mg by mouth at bedtime.   Yes Historical Provider, MD  guaiFENesin (ROBITUSSIN) 100 MG/5ML SOLN Take 15 mLs by mouth at bedtime as needed for cough or to loosen phlegm.   Yes Historical Provider, MD  Influenza vac split quadrivalent PF (AFLURIA QUADRIVALENT) 0.5 ML injection Inject 0.5 mLs into the muscle once.   Yes Historical Provider, MD  levofloxacin (LEVAQUIN) 500 MG tablet Take 500 mg by mouth daily.   Yes Historical Provider, MD  levothyroxine (SYNTHROID, LEVOTHROID) 100 MCG tablet Take 100 mcg by mouth daily before breakfast.   Yes Historical Provider, MD  lidocaine (LIDODERM) 5 % Place 0.5 patches onto the skin daily. To right knee and Left shoulder   Yes Historical Provider, MD  LORazepam (ATIVAN) 0.5  MG tablet Take 0.25 mg by mouth every 6 (six) hours as needed for anxiety.   Yes Historical Provider, MD  metoprolol tartrate (LOPRESSOR) 25 MG tablet Take 12.5 mg by mouth 2 (two) times daily.   Yes Historical Provider, MD  polyethylene glycol (MIRALAX / GLYCOLAX) packet Take 17 g by mouth daily.   Yes Historical Provider, MD  predniSONE (DELTASONE) 20 MG tablet Take 40 mg by mouth daily with breakfast.   Yes Historical Provider, MD  senna (SENOKOT) 8.6 MG TABS tablet Take 1 tablet by mouth daily.   Yes Historical Provider, MD  sodium chloride (OCEAN) 0.65 % SOLN nasal spray Place 1 spray  into both nostrils as needed for congestion.   Yes Historical Provider, MD  traMADol-acetaminophen (ULTRACET) 37.5-325 MG tablet Take 1 tablet by mouth every 6 (six) hours as needed for moderate pain.   Yes Historical Provider, MD  UNABLE TO FIND Take 60 mLs by mouth 2 (two) times daily. Med Name: Medpass   Yes Historical Provider, MD   BP 93/42 mmHg  Pulse 94  Temp(Src) 99.5 F (37.5 C) (Rectal)  Resp 19  SpO2 93% Physical Exam  Constitutional: She is oriented to person, place, and time. No distress.  Elderly, chronically ill appearing, intermittently calling out  HENT:  Head: Normocephalic and atraumatic.  Mucus membranes dry  Eyes: Pupils are equal, round, and reactive to light.  Cardiovascular: Normal heart sounds.   No murmur heard. Tachycardia, irregular rhythm  Pulmonary/Chest: Effort normal. No respiratory distress. She has wheezes. She has rales.  Abdominal: Soft. Bowel sounds are normal. There is no tenderness. There is no rebound.  Musculoskeletal: She exhibits edema.  Neurological: She is alert and oriented to person, place, and time.  Skin: Skin is warm and dry.  Discoloration noted to the right second toe  Psychiatric: She has a normal mood and affect.  Nursing note and vitals reviewed.   ED Course  Procedures (including critical care time)  CRITICAL CARE Performed by: Shon Baton   Total critical care time: 65 minutes  Critical care time was exclusive of separately billable procedures and treating other patients.  Critical care was necessary to treat or prevent imminent or life-threatening deterioration.  Critical care was time spent personally by me on the following activities: development of treatment plan with patient and/or surrogate as well as nursing, discussions with consultants, evaluation of patient's response to treatment, examination of patient, obtaining history from patient or surrogate, ordering and performing treatments and  interventions, ordering and review of laboratory studies, ordering and review of radiographic studies, pulse oximetry and re-evaluation of patient's condition.   DIAGNOSTIC STUDIES: Oxygen Saturation is 95% on RA, normal by my interpretation.    COORDINATION OF CARE: 12:04 AM Will order lab work and imaging. Discussed with family they agree with plan.    2:25 AM Discussed case with Jilda Panda .    Labs Review Labs Reviewed  COMPREHENSIVE METABOLIC PANEL - Abnormal; Notable for the following:    Sodium 134 (*)    Potassium 5.5 (*)    CO2 19 (*)    Glucose, Bld 138 (*)    BUN 90 (*)    Creatinine, Ser 1.90 (*)    Calcium 8.0 (*)    Total Protein 5.0 (*)    Albumin 2.5 (*)    ALT 12 (*)    GFR calc non Af Amer 22 (*)    GFR calc Af Amer 26 (*)    All other components within  normal limits  CBC WITH DIFFERENTIAL/PLATELET - Abnormal; Notable for the following:    WBC 15.8 (*)    RBC 3.15 (*)    Hemoglobin 8.8 (*)    HCT 27.2 (*)    Neutro Abs 12.9 (*)    Monocytes Absolute 1.2 (*)    All other components within normal limits  PROTIME-INR - Abnormal; Notable for the following:    Prothrombin Time 78.8 (*)    INR >10.00 (*)    All other components within normal limits  I-STAT CG4 LACTIC ACID, ED - Abnormal; Notable for the following:    Lactic Acid, Venous 3.19 (*)    All other components within normal limits  CULTURE, BLOOD (ROUTINE X 2)  CULTURE, BLOOD (ROUTINE X 2)  URINE CULTURE  CULTURE, EXPECTORATED SPUTUM-ASSESSMENT  GRAM STAIN  URINALYSIS, ROUTINE W REFLEX MICROSCOPIC (NOT AT [facility])  TROPONIN I  HIV ANTIBODY (ROUTINE TESTING)  LEGIONELLA PNEUMOPHILA SEROGP 1 UR AG  STREP PNEUMONIAE URINARY ANTIGEN  CBC  BASIC METABOLIC PANEL  I-STAT CG4 LACTIC ACID, ED    Imaging Review Dg Chest Port 1 View  09/10/2015  CLINICAL DATA:  Dyspnea, weakness, abdominal pain EXAM: PORTABLE CHEST 1 VIEW COMPARISON:  None. FINDINGS: Opacity in the lateral left base may represent  atelectasis or infectious infiltrate. No large effusions. Right lung is clear. Normal pulmonary vasculature. IMPRESSION: Lateral left base opacity, likely atelectasis or infectious infiltrate. Electronically Signed   By: Ellery Plunk M.D.   On: 09/10/2015 00:24   I have personally reviewed and evaluated these images and lab results as part of my medical decision-making.   EKG Interpretation   Date/Time:  Wednesday September 09 2015 23:58:45 EST Ventricular Rate:  149 PR Interval:    QRS Duration: 70 QT Interval:  285 QTC Calculation: 449 R Axis:   -52 Text Interpretation:  Atrial fibrillation with rapid V-rate Left anterior  fascicular block Low voltage, precordial leads Abnormal R-wave  progression, late transition ST depression, probably rate related  Confirmed by Markese Bloxham  MD, Oseias Horsey ([id number]) on 09/10/2015 12:33:24 AM     Medications  vancomycin (VANCOCIN) IVPB 1000 mg/200 mL premix (1,000 mg Intravenous New Bag/Given 09/10/15 0228)  0.9 %  sodium chloride infusion (not administered)  sodium chloride 0.9 % bolus 1,000 mL (0 mLs Intravenous Stopped 09/10/15 0226)  cefTAZidime (FORTAZ) 2 g in dextrose 5 % 50 mL IVPB (0 g Intravenous Stopped 09/10/15 0225)  metoprolol (LOPRESSOR) injection 5 mg (5 mg Intravenous Given 09/10/15 0149)  dextrose 50 % solution 50 mL (50 mLs Intravenous Given 09/10/15 0232)  insulin aspart (novoLOG) injection 10 Units (10 Units Intravenous Given 09/10/15 0234)  phytonadione (VITAMIN K) tablet 5 mg (5 mg Oral Given 09/10/15 0201)  sodium chloride 0.9 % bolus 1,000 mL (1,000 mLs Intravenous New Bag/Given 09/10/15 0240)    MDM   Final diagnoses:  Septic shock (HCC)  Atrial fibrillation with rapid ventricular response (HCC)  Hyperkalemia  Acute renal failure, unspecified acute renal failure type [facility])    Patient presents with altered mental status, hypertension, and tachycardia. Recently diagnosed with pneumonia. She is not febrile here but given vital sign  abnormalities, would have concern for sepsis. She is altered and gives limited history. She is DO NOT RESUSCITATE and this was confirmed with her daughter. Daughter also requests minimal aggressive intervention including central line or pressors. X-ray does show infiltrate. Patient was given 2 L of fluid and broad-spectrum antibiotics. She is in atrial fibrillation with RVR. No known history of  this. While I feel that she is likely in atrial fibrillation as a result of sepsis, her rate may also be worsening her blood pressure. For this reason, patient was given 5 mg of metoprolol. Heart rate improved and blood pressure now 90s over 40s.  Lactate is 3. Patient has leukocytosis, evidence of hyperkalemia, and acute renal failure. Overall picture concerning for septic shock. Given goals of care, will admit patient to the stepdown unit under the hospitalist service.  Discussed with Dr. Julian Reil.  I personally performed the services described in this documentation, which was scribed in my presence. The recorded information has been reviewed and is accurate.   Shon Baton, MD 09/10/15 [phone]  I was called to the patient's bedside. Patient now actively vomiting. INR greater than 10. Vomitus appears bloody. She continues to be hypotensive. Family is amenable to aggressive reversal. Given hypotension, patient will need quick reversal of her supratherapeutic INR. Discussed with the hospitalist. Will order [facility].  Shon Baton, MD 09/10/15 [phone]

## 2015-09-10 ENCOUNTER — Inpatient Hospital Stay (HOSPITAL_COMMUNITY): Payer: Medicare Other

## 2015-09-10 ENCOUNTER — Emergency Department (HOSPITAL_COMMUNITY): Payer: Medicare Other

## 2015-09-10 ENCOUNTER — Encounter (HOSPITAL_COMMUNITY): Payer: Self-pay | Admitting: Internal Medicine

## 2015-09-10 DIAGNOSIS — K922 Gastrointestinal hemorrhage, unspecified: Secondary | ICD-10-CM | POA: Diagnosis present

## 2015-09-10 DIAGNOSIS — R0600 Dyspnea, unspecified: Secondary | ICD-10-CM | POA: Diagnosis not present

## 2015-09-10 DIAGNOSIS — A419 Sepsis, unspecified organism: Secondary | ICD-10-CM | POA: Diagnosis not present

## 2015-09-10 DIAGNOSIS — J189 Pneumonia, unspecified organism: Secondary | ICD-10-CM | POA: Diagnosis not present

## 2015-09-10 DIAGNOSIS — Z66 Do not resuscitate: Secondary | ICD-10-CM | POA: Diagnosis present

## 2015-09-10 DIAGNOSIS — R4182 Altered mental status, unspecified: Secondary | ICD-10-CM | POA: Diagnosis present

## 2015-09-10 DIAGNOSIS — F0391 Unspecified dementia with behavioral disturbance: Secondary | ICD-10-CM | POA: Diagnosis present

## 2015-09-10 DIAGNOSIS — Z515 Encounter for palliative care: Secondary | ICD-10-CM | POA: Insufficient documentation

## 2015-09-10 DIAGNOSIS — R791 Abnormal coagulation profile: Secondary | ICD-10-CM | POA: Diagnosis present

## 2015-09-10 DIAGNOSIS — R6520 Severe sepsis without septic shock: Secondary | ICD-10-CM

## 2015-09-10 DIAGNOSIS — I4891 Unspecified atrial fibrillation: Secondary | ICD-10-CM | POA: Diagnosis not present

## 2015-09-10 DIAGNOSIS — N179 Acute kidney failure, unspecified: Secondary | ICD-10-CM | POA: Diagnosis not present

## 2015-09-10 DIAGNOSIS — Z993 Dependence on wheelchair: Secondary | ICD-10-CM | POA: Diagnosis not present

## 2015-09-10 DIAGNOSIS — D6832 Hemorrhagic disorder due to extrinsic circulating anticoagulants: Secondary | ICD-10-CM | POA: Diagnosis present

## 2015-09-10 DIAGNOSIS — R6521 Severe sepsis with septic shock: Secondary | ICD-10-CM | POA: Diagnosis present

## 2015-09-10 DIAGNOSIS — T45515A Adverse effect of anticoagulants, initial encounter: Secondary | ICD-10-CM

## 2015-09-10 DIAGNOSIS — D62 Acute posthemorrhagic anemia: Secondary | ICD-10-CM | POA: Diagnosis present

## 2015-09-10 DIAGNOSIS — R451 Restlessness and agitation: Secondary | ICD-10-CM | POA: Diagnosis present

## 2015-09-10 DIAGNOSIS — Z7952 Long term (current) use of systemic steroids: Secondary | ICD-10-CM | POA: Diagnosis not present

## 2015-09-10 DIAGNOSIS — E875 Hyperkalemia: Secondary | ICD-10-CM | POA: Diagnosis present

## 2015-09-10 DIAGNOSIS — I10 Essential (primary) hypertension: Secondary | ICD-10-CM | POA: Diagnosis present

## 2015-09-10 DIAGNOSIS — Y95 Nosocomial condition: Secondary | ICD-10-CM | POA: Diagnosis present

## 2015-09-10 LAB — I-STAT CG4 LACTIC ACID, ED
Lactic Acid, Venous: 3.19 mmol/L (ref 0.5–2.0)
Lactic Acid, Venous: 3.76 mmol/L (ref 0.5–2.0)

## 2015-09-10 LAB — BASIC METABOLIC PANEL
Anion gap: 11 (ref 5–15)
BUN: 84 mg/dL — AB (ref 6–20)
CHLORIDE: 108 mmol/L (ref 101–111)
CO2: 16 mmol/L — ABNORMAL LOW (ref 22–32)
CREATININE: 1.61 mg/dL — AB (ref 0.44–1.00)
Calcium: 7.1 mg/dL — ABNORMAL LOW (ref 8.9–10.3)
GFR calc Af Amer: 31 mL/min — ABNORMAL LOW (ref >60)
GFR calc non Af Amer: 27 mL/min — ABNORMAL LOW (ref >60)
GLUCOSE: 104 mg/dL — AB (ref 65–99)
POTASSIUM: 4.4 mmol/L (ref 3.5–5.1)
Sodium: 135 mmol/L (ref 135–145)

## 2015-09-10 LAB — COMPREHENSIVE METABOLIC PANEL
ALBUMIN: 2.5 g/dL — AB (ref 3.5–5.0)
ALT: 12 U/L — ABNORMAL LOW (ref 14–54)
ANION GAP: 11 (ref 5–15)
AST: 24 U/L (ref 15–41)
Alkaline Phosphatase: 51 U/L (ref 38–126)
BUN: 90 mg/dL — AB (ref 6–20)
CHLORIDE: 104 mmol/L (ref 101–111)
CO2: 19 mmol/L — ABNORMAL LOW (ref 22–32)
Calcium: 8 mg/dL — ABNORMAL LOW (ref 8.9–10.3)
Creatinine, Ser: 1.9 mg/dL — ABNORMAL HIGH (ref 0.44–1.00)
GFR calc Af Amer: 26 mL/min — ABNORMAL LOW (ref >60)
GFR, EST NON AFRICAN AMERICAN: 22 mL/min — AB (ref >60)
Glucose, Bld: 138 mg/dL — ABNORMAL HIGH (ref 65–99)
POTASSIUM: 5.5 mmol/L — AB (ref 3.5–5.1)
Sodium: 134 mmol/L — ABNORMAL LOW (ref 135–145)
Total Bilirubin: 0.5 mg/dL (ref 0.3–1.2)
Total Protein: 5 g/dL — ABNORMAL LOW (ref 6.5–8.1)

## 2015-09-10 LAB — CBC
HCT: 20.9 % — ABNORMAL LOW (ref 36.0–46.0)
HEMOGLOBIN: 6.7 g/dL — AB (ref 12.0–15.0)
MCH: 27.8 pg (ref 26.0–34.0)
MCHC: 32.1 g/dL (ref 30.0–36.0)
MCV: 86.7 fL (ref 78.0–100.0)
Platelets: 276 10*3/uL (ref 150–400)
RBC: 2.41 MIL/uL — AB (ref 3.87–5.11)
RDW: 15.2 % (ref 11.5–15.5)
WBC: 19.6 10*3/uL — ABNORMAL HIGH (ref 4.0–10.5)

## 2015-09-10 LAB — CBC WITH DIFFERENTIAL/PLATELET
BASOS ABS: 0 10*3/uL (ref 0.0–0.1)
Basophils Relative: 0 %
EOS PCT: 0 %
Eosinophils Absolute: 0 10*3/uL (ref 0.0–0.7)
HEMATOCRIT: 27.2 % — AB (ref 36.0–46.0)
HEMOGLOBIN: 8.8 g/dL — AB (ref 12.0–15.0)
LYMPHS ABS: 1.8 10*3/uL (ref 0.7–4.0)
LYMPHS PCT: 11 %
MCH: 27.9 pg (ref 26.0–34.0)
MCHC: 32.4 g/dL (ref 30.0–36.0)
MCV: 86.3 fL (ref 78.0–100.0)
Monocytes Absolute: 1.2 10*3/uL — ABNORMAL HIGH (ref 0.1–1.0)
Monocytes Relative: 7 %
NEUTROS ABS: 12.9 10*3/uL — AB (ref 1.7–7.7)
Neutrophils Relative %: 82 %
PLATELETS: 363 10*3/uL (ref 150–400)
RBC: 3.15 MIL/uL — AB (ref 3.87–5.11)
RDW: 15.2 % (ref 11.5–15.5)
WBC: 15.8 10*3/uL — AB (ref 4.0–10.5)

## 2015-09-10 LAB — PROTIME-INR
INR: >10 (ref 0.00–1.49)
INR: 1.18 (ref 0.00–1.49)
Prothrombin Time: 78.8 seconds — ABNORMAL HIGH (ref 11.6–15.2)
Prothrombin Time: 15.1 seconds (ref 11.6–15.2)

## 2015-09-10 LAB — HIV ANTIBODY (ROUTINE TESTING W REFLEX): HIV Screen 4th Generation wRfx: NONREACTIVE

## 2015-09-10 LAB — TROPONIN I
TROPONIN I: 0.37 ng/mL — AB (ref <0.031)
TROPONIN I: 0.6 ng/mL — AB (ref <0.031)

## 2015-09-10 MED ORDER — FENTANYL CITRATE (PF) 100 MCG/2ML IJ SOLN
25.0000 ug | Freq: Once | INTRAMUSCULAR | Status: AC
  Administered 2015-09-10: 25 ug via INTRAVENOUS
  Filled 2015-09-10: qty 2

## 2015-09-10 MED ORDER — MORPHINE SULFATE (PF) 2 MG/ML IV SOLN
2.0000 mg | INTRAVENOUS | Status: DC | PRN
  Administered 2015-09-10 – 2015-09-11 (×4): 2 mg via INTRAVENOUS
  Filled 2015-09-10 (×4): qty 1

## 2015-09-10 MED ORDER — ONDANSETRON HCL 4 MG/2ML IJ SOLN
INTRAMUSCULAR | Status: AC
  Administered 2015-09-10: 4 mg
  Filled 2015-09-10: qty 2

## 2015-09-10 MED ORDER — PHYTONADIONE 5 MG PO TABS
5.0000 mg | ORAL_TABLET | Freq: Once | ORAL | Status: AC
  Administered 2015-09-10: 5 mg via ORAL
  Filled 2015-09-10: qty 1

## 2015-09-10 MED ORDER — GLYCOPYRROLATE 0.2 MG/ML IJ SOLN: 0.2000 mg | INTRAMUSCULAR | Status: DC | PRN

## 2015-09-10 MED ORDER — VITAMIN K1 10 MG/ML IJ SOLN
10.0000 mg | INTRAVENOUS | Status: AC
  Administered 2015-09-10: 10 mg via INTRAVENOUS
  Filled 2015-09-10: qty 1

## 2015-09-10 MED ORDER — ALBUTEROL SULFATE (2.5 MG/3ML) 0.083% IN NEBU: 2.5000 mg | INHALATION_SOLUTION | RESPIRATORY_TRACT | Status: DC | PRN

## 2015-09-10 MED ORDER — DEXTROSE 5 % IV SOLN: 2.0000 g | INTRAVENOUS | Status: DC

## 2015-09-10 MED ORDER — BIOTENE DRY MOUTH MT LIQD: 15.0000 mL | OROMUCOSAL | Status: DC | PRN

## 2015-09-10 MED ORDER — VANCOMYCIN HCL IN DEXTROSE 1-5 GM/200ML-% IV SOLN: 1000.0000 mg | INTRAVENOUS | Status: DC

## 2015-09-10 MED ORDER — POLYVINYL ALCOHOL 1.4 % OP SOLN
1.0000 [drp] | Freq: Four times a day (QID) | OPHTHALMIC | Status: DC | PRN
  Filled 2015-09-10: qty 15

## 2015-09-10 MED ORDER — SODIUM CHLORIDE 0.9 % IV BOLUS (SEPSIS)
1000.0000 mL | INTRAVENOUS | Status: AC
  Administered 2015-09-10 (×2): 1000 mL via INTRAVENOUS

## 2015-09-10 MED ORDER — DEXTROSE 50 % IV SOLN
1.0000 | Freq: Once | INTRAVENOUS | Status: AC
  Administered 2015-09-10: 50 mL via INTRAVENOUS
  Filled 2015-09-10: qty 50

## 2015-09-10 MED ORDER — SODIUM CHLORIDE 0.9 % IV SOLN
Freq: Once | INTRAVENOUS | Status: AC
  Administered 2015-09-10: 04:00:00 via INTRAVENOUS

## 2015-09-10 MED ORDER — ONDANSETRON HCL 4 MG/2ML IJ SOLN
4.0000 mg | Freq: Four times a day (QID) | INTRAMUSCULAR | Status: DC | PRN
  Administered 2015-09-10 – 2015-09-11 (×2): 4 mg via INTRAVENOUS
  Filled 2015-09-10 (×2): qty 2

## 2015-09-10 MED ORDER — MORPHINE SULFATE (PF) 2 MG/ML IV SOLN
2.0000 mg | INTRAVENOUS | Status: DC | PRN
  Administered 2015-09-10: 2 mg via INTRAVENOUS
  Filled 2015-09-10: qty 1

## 2015-09-10 MED ORDER — DEXTROSE 5 % IV SOLN: 2.0000 g | Freq: Three times a day (TID) | INTRAVENOUS | Status: DC

## 2015-09-10 MED ORDER — VANCOMYCIN HCL IN DEXTROSE 1-5 GM/200ML-% IV SOLN
1000.0000 mg | Freq: Once | INTRAVENOUS | Status: AC
  Administered 2015-09-10: 1000 mg via INTRAVENOUS
  Filled 2015-09-10: qty 200

## 2015-09-10 MED ORDER — CHLORHEXIDINE GLUCONATE 0.12 % MT SOLN: 15.0000 mL | Freq: Two times a day (BID) | OROMUCOSAL | Status: DC

## 2015-09-10 MED ORDER — FENTANYL CITRATE (PF) 100 MCG/2ML IJ SOLN: 25.0000 ug | INTRAMUSCULAR | Status: DC | PRN

## 2015-09-10 MED ORDER — METOPROLOL TARTRATE 1 MG/ML IV SOLN
5.0000 mg | Freq: Once | INTRAVENOUS | Status: AC
  Administered 2015-09-10: 5 mg via INTRAVENOUS
  Filled 2015-09-10: qty 5

## 2015-09-10 MED ORDER — PROTHROMBIN COMPLEX CONC HUMAN 500 UNITS IV KIT
3822.0000 [IU] | PACK | Status: AC
  Administered 2015-09-10: 3822 [IU] via INTRAVENOUS
  Filled 2015-09-10: qty 153

## 2015-09-10 MED ORDER — DEXTROSE 5 % IV SOLN
2.0000 g | Freq: Once | INTRAVENOUS | Status: AC
  Administered 2015-09-10: 2 g via INTRAVENOUS
  Filled 2015-09-10: qty 2

## 2015-09-10 MED ORDER — LORAZEPAM 2 MG/ML IJ SOLN
1.0000 mg | INTRAMUSCULAR | Status: DC | PRN
  Administered 2015-09-11: 1 mg via INTRAVENOUS
  Filled 2015-09-10 (×2): qty 1

## 2015-09-10 MED ORDER — SODIUM CHLORIDE 0.9 % IV SOLN
INTRAVENOUS | Status: DC
  Administered 2015-09-10: 08:00:00 via INTRAVENOUS

## 2015-09-10 MED ORDER — CETYLPYRIDINIUM CHLORIDE 0.05 % MT LIQD: 7.0000 mL | Freq: Two times a day (BID) | OROMUCOSAL | Status: DC

## 2015-09-10 MED ORDER — INSULIN ASPART 100 UNIT/ML ~~LOC~~ SOLN
10.0000 [IU] | Freq: Once | SUBCUTANEOUS | Status: AC
  Administered 2015-09-10: 10 [IU] via INTRAVENOUS
  Filled 2015-09-10: qty 1

## 2015-09-10 MED ORDER — SODIUM CHLORIDE 0.9 % IV BOLUS (SEPSIS)
1000.0000 mL | Freq: Once | INTRAVENOUS | Status: AC
  Administered 2015-09-10: 1000 mL via INTRAVENOUS

## 2015-09-10 MED ORDER — ONDANSETRON HCL 4 MG/2ML IJ SOLN: 4.0000 mg | Freq: Once | INTRAMUSCULAR | Status: DC

## 2015-09-10 MED ORDER — ONDANSETRON HCL 4 MG/2ML IJ SOLN
4.0000 mg | Freq: Once | INTRAMUSCULAR | Status: DC
  Filled 2015-09-10: qty 2

## 2015-09-10 NOTE — Consult Note (Signed)
Consultation Note Date: 09/10/2015   Patient Name: Pam Wheeler  DOB: 31-Mar-1923  MRN: [id number]  Age / Sex: 81 y.o., female  PCP: No primary care provider on file. Referring Physician: Elease EtienneAnand D Hongalgi, MD  Reason for Consultation: Establishing goals of care    Clinical Assessment/Narrative: Pam Wheeler is resting when I came to visit with her. Her granddaughter and grandson are at bedside and confirm the plan for comfort per their mother, Pam Wheeler. Pam Wheeler has gone home to rest and will be back later this evening. Discussed goals for comfort and strategies to help keep her comfortable. She exhibits no distress or discomfort at this time. Hands and feet are noted to be cold to the touch. Prognosis very poor. Per RN Pam Wheeler has been very agitated with frequent bowel movements (GIB) but has been resting comfortably the past few hours. RN also noted patient to be wheezing earlier but no wheezing currently - may add nebulizer for comfort if patient tolerates. Family says that she has [facility] ~8 years and has been happy there and is generally a happy person with no anxiety or pain to their knowledge. We decide to focus on comfort and reassess how she is doing tomorrow. Will follow up with daughter, Pam Wheeler, tomorrow.   Contacts/Participants in Discussion: Primary Decision Maker: Marnette BurgessPamela Isley   Relationship to Patient daughter HCPOA: yes   SUMMARY OF RECOMMENDATIONS - FULL comfort care - Possible hospital death  Code Status/Advance Care Planning: DNR    Code Status Orders        Start     Ordered   09/10/15 0228  Do not attempt resuscitation (DNR)   Continuous    Question Answer Comment  In the event of cardiac or respiratory ARREST Do not call a "code blue"   In the event of cardiac or respiratory ARREST Do not perform Intubation, CPR, defibrillation or ACLS   In the event  of cardiac or respiratory ARREST Use medication by any route, position, wound care, and other measures to relive pain and suffering. May use oxygen, suction and manual treatment of airway obstruction as needed for comfort.      09/10/15 0230    Advance Directive Documentation        Most Recent Value   Type of Advance Directive  -- [DNR]   Pre-existing out of facility DNR order (yellow form or pink MOST form)     "MOST" Form in Place?        Symptom Management:   Pain: Morphine 2 mg every 2 hours prn. May increase dosage/frequency as needed for comfort.   Anxiety: Lorazepam 1 mg every 4 hours prn.  Secretions: none currently but may make available robinul prn.   Wheezing: Albuterol prn. May also utilize morphine for respiratory distress.   Palliative Prophylaxis:   Frequent Pain Assessment and Turn Reposition, oral care  Additional Recommendations (Limitations, Scope, Preferences):  Full Comfort Care  Psycho-social/Spiritual:  Support System: Strong Desire for further Chaplaincy support:no Additional Recommendations: Education on Hospice  Prognosis: Hours - Days  Discharge Planning: Hospital death vs hospice facility. Will reassess tomorrow.    Chief Complaint/ Primary Diagnoses: Present on Admission:  . Severe sepsis with acute organ dysfunction (HCC) . HCAP (healthcare-associated pneumonia) . Acute kidney failure (HCC) . Atrial fibrillation with RVR (HCC) . Warfarin-induced coagulopathy (HCC) . Severe sepsis (HCC)  I have reviewed the medical record, interviewed the patient and family, and examined the patient.  The following aspects are pertinent.  Past Medical History  Diagnosis Date  . HTN (hypertension)   . DVT (deep venous thrombosis) (HCC)    Social History   Social History  . Marital Status: Widowed    Spouse Name: N/A  . Number of Children: N/A  . Years of Education: N/A   Social History Main Topics  . Smoking status: Never Smoker   .  Smokeless tobacco: Not on file  . Alcohol Use: No  . Drug Use: No  . Sexual Activity: Not on file   Other Topics Concern  . Not on file   Social History Narrative  . No narrative on file   Family History  Problem Relation Age of Onset  . CAD    . Cancer Neg Hx    Scheduled Meds:  Continuous Infusions: . sodium chloride 10 mL/hr at 09/10/15 0845   PRN Meds:.albuterol, glycopyrrolate, LORazepam, morphine injection, ondansetron (ZOFRAN) IV Medications Prior to Admission:  Prior to Admission medications   Medication Sig Start Date End Date Taking? Authorizing Provider  albuterol (PROVENTIL) (2.5 MG/3ML) 0.083% nebulizer solution Take 2.5 mg by nebulization every 6 (six) hours as needed for wheezing or shortness of breath.   Yes Historical Provider, MD  albuterol (PROVENTIL) (2.5 MG/3ML) 0.083% nebulizer solution Take 2.5 mg by nebulization 3 (three) times daily.   Yes Historical Provider, MD  buPROPion (WELLBUTRIN XL) 150 MG 24 hr tablet Take 300 mg by mouth daily.   Yes Historical Provider, MD  chlorpheniramine (CHLOR-TRIMETON) 4 MG tablet Take 4 mg by mouth every 4 (four) hours as needed for allergies.   Yes Historical Provider, MD  divalproex (DEPAKOTE) 125 MG DR tablet Take 250 mg by mouth at bedtime.   Yes Historical Provider, MD  docusate sodium (COLACE) 100 MG capsule Take 100 mg by mouth 2 (two) times daily.   Yes Historical Provider, MD  donepezil (ARICEPT) 10 MG tablet Take 10 mg by mouth at bedtime.   Yes Historical Provider, MD  guaiFENesin (ROBITUSSIN) 100 MG/5ML SOLN Take 15 mLs by mouth at bedtime as needed for cough or to loosen phlegm.   Yes Historical Provider, MD  Influenza vac split quadrivalent PF (AFLURIA QUADRIVALENT) 0.5 ML injection Inject 0.5 mLs into the muscle once.   Yes Historical Provider, MD  levofloxacin (LEVAQUIN) 500 MG tablet Take 500 mg by mouth daily.   Yes Historical Provider, MD  levothyroxine (SYNTHROID, LEVOTHROID) 100 MCG tablet Take 100 mcg by  mouth daily before breakfast.   Yes Historical Provider, MD  lidocaine (LIDODERM) 5 % Place 0.5 patches onto the skin daily. To right knee and Left shoulder   Yes Historical Provider, MD  LORazepam (ATIVAN) 0.5 MG tablet Take 0.25 mg by mouth every 6 (six) hours as needed for anxiety.   Yes Historical Provider, MD  metoprolol tartrate (LOPRESSOR) 25 MG tablet Take 12.5 mg by mouth 2 (two) times daily.   Yes Historical Provider, MD  polyethylene glycol (MIRALAX / GLYCOLAX) packet Take 17 g by mouth daily.   Yes Historical Provider, MD  predniSONE (DELTASONE) 20 MG tablet Take 40 mg by mouth daily with breakfast.   Yes Historical Provider, MD  senna (SENOKOT) 8.6 MG TABS tablet Take 1 tablet by mouth daily.   Yes Historical Provider, MD  sodium chloride (OCEAN) 0.65 % SOLN nasal spray Place 1 spray into both nostrils as needed for congestion.   Yes Historical Provider, MD  traMADol-acetaminophen (ULTRACET) 37.5-325 MG tablet Take 1 tablet by mouth every  6 (six) hours as needed for moderate pain.   Yes Historical Provider, MD  UNABLE TO FIND Take 60 mLs by mouth 2 (two) times daily. Med Name: Medpass   Yes Historical Provider, MD   No Known Allergies  Review of Systems  Unable to perform ROS   Physical Exam  Constitutional: She appears well-developed. She appears lethargic.  Cardiovascular: Normal rate.   Respiratory: Effort normal. No accessory muscle usage. No tachypnea. No respiratory distress.  GI: Normal appearance.  Neurological: She appears lethargic.    Vital Signs: BP 132/54 mmHg  Pulse 70  Temp(Src) 98.6 F (37 C) (Oral)  Resp 20  Wt 81.647 kg (180 lb)  SpO2 89%  SpO2: SpO2: (!) 89 % O2 Device:SpO2: (!) 89 % O2 Flow Rate: .   IO: Intake/output summary:  Intake/Output Summary (Last 24 hours) at 09/10/15 1541 Last data filed at 09/10/15 1400  Gross per 24 hour  Intake 3160.83 ml  Output      0 ml  Net 3160.83 ml    LBM: Last BM Date: 09/10/15 Baseline Weight:  Weight: 81.647 kg (180 lb) Most recent weight: Weight: 81.647 kg (180 lb)      Palliative Assessment/Data:  Flowsheet Rows        Most Recent Value   Intake Tab    Referral Department  Hospitalist   Unit at Time of Referral  ER   Palliative Care Primary Diagnosis  Sepsis/Infectious Disease   Date Notified  09/10/15   Palliative Care Type  New Palliative care   Reason for referral  End of Life Care Assistance   Date of Admission  09/09/15   # of days IP prior to Palliative referral  1   Clinical Assessment    Psychosocial & Spiritual Assessment    Palliative Care Outcomes       Additional Data Reviewed:  CBC:    Component Value Date/Time   WBC 19.6* 09/10/2015 0651   HGB 6.7* 09/10/2015 0651   HCT 20.9* 09/10/2015 0651   PLT 276 09/10/2015 0651   MCV 86.7 09/10/2015 0651   NEUTROABS 12.9* 09/10/2015 0039   LYMPHSABS 1.8 09/10/2015 0039   MONOABS 1.2* 09/10/2015 0039   EOSABS 0.0 09/10/2015 0039   BASOSABS 0.0 09/10/2015 0039   Comprehensive Metabolic Panel:    Component Value Date/Time   NA 135 09/10/2015 0651   K 4.4 09/10/2015 0651   CL 108 09/10/2015 0651   CO2 16* 09/10/2015 0651   BUN 84* 09/10/2015 0651   CREATININE 1.61* 09/10/2015 0651   GLUCOSE 104* 09/10/2015 0651   CALCIUM 7.1* 09/10/2015 0651   AST 24 09/10/2015 0039   ALT 12* 09/10/2015 0039   ALKPHOS 51 09/10/2015 0039   BILITOT 0.5 09/10/2015 0039   PROT 5.0* 09/10/2015 0039   ALBUMIN 2.5* 09/10/2015 0039     Time In: 1440 Time Out: 1530 Time Total: Greater than 50%  of this time was spent counseling and coordinating care related to the above assessment and plan.  Signed by: Ulice Bold, NP  Ulice Bold, NP  09/10/2015, 3:41 PM  Please contact Palliative Medicine Team phone at [phone] for questions and concerns.

## 2015-09-10 NOTE — ED Notes (Signed)
Phlebotomy at bedside at this time.

## 2015-09-10 NOTE — Progress Notes (Addendum)
Chart reviewed. Patient elderly, chronically ill looking, intermittent agitation, mild increased WOB, wheezing, mouth with dried coffee ground, RS- reduced BS's, Abd- soft, CNS - alert and oriented only to self- intermittently tearful and keeps saying "I'm hurting and want to die". Discussed at length with patient's daughter/HCPOA Ms. Pam Wheeler at bedside. Patient is resident of [facility] for last 8.5 years. At baseline, she has severe dementia with behavioral problems (confusion, agitation), wheelchair bound for last 8 years with no QOL. She is critically ill with severe sepsis from HCAP, acute UGIB in context of Coumadin Coagulopathy, ABLA, new Afib with RVR. Her prognosis is poor. As per daughter, patient has been suffering for a long time and is currently in pain and asking to die! I discussed options of continued aggressive medical care versus full comfort care. Ms. Pam Wheeler absolutely wants full comfort care. She was wondering why someone did not discuss this option with her much earlier. She agrees to DC labs, IVF, Abx, PRBC transfusion, vitals and focus only on comfort. Will transition to full comfort care. Start IV Morphine, Ativan, IV Zofran. Palliative Care consulted. She had been on hospice twice in the past. Discussed with Dr. Merlene LaughterHal Stoneking, PCP who is in agreement with current plan of care.  Discussed with patient's ED nurses.  Pam ScottHONGALGI,Pam Candy, MD, FACP, FHM. [facility] Pager [phone]  If 7PM-7AM, please contact night-coverage [email or web address] Password [email or web address] 09/10/2015, 8:24 AM

## 2015-09-10 NOTE — ED Notes (Signed)
Called report to [phone], spoke with Gillis EndsSybil, RN.  Unable to give report at this time.  Will try back in 10 minutes.

## 2015-09-10 NOTE — ED Notes (Signed)
MD at bedside. 

## 2015-09-10 NOTE — Progress Notes (Signed)
Admission note: This is a late entry.  Arrival Method: Via stretcher from ED. Mental Status: Oriented to person, place. Keeps stating "I'm dying, help me, I'm dying." Telemetry: N/A.  Skin: See flow sheet documentation.  Tubes: Attempted to place patient on oxygen n/c but pt became very agitated. O2 sats 89% on RA.  Pain: Patient appears very agitated, continues to shout out. PRN morphine administered.  Family: No one at bedside on admission. Living Situation: From SNF. Safety Measures: Call bell within reach, side rails in place, bed alarm on middle setting. 6E Orientation: Attempted to orient patient to unit and surroundings. Instructed to call RN for assistance.  Leanna BattlesEckelmann, Raney Koeppen Eileen, RN.

## 2015-09-10 NOTE — ED Notes (Signed)
Pt vomiting large amounts of dark blood. Dr.Horton gave verbal for zofran and at bedside. Dr.Gardner paged

## 2015-09-10 NOTE — ED Notes (Signed)
Patient transported to CT 

## 2015-09-10 NOTE — ED Notes (Signed)
Chaplain at bedside with patient/daughter.

## 2015-09-10 NOTE — H&P (Signed)
[facility] History and Physical  Pam Wheeler BJY:[id number] DOB: May 16, 1923 DOA: 09/09/2015  Referring physician: EDP PCP: No primary care provider on file.   Chief Complaint: AMS   HPI: Pam Wheeler is a 81 y.o. female brought in to the ED with c/o AMS that onset earlier this afternoon.  Patient has recently been diagnosed with PNA and is being treated with levaquin.  On arrival to the ED patient was running HR up into the 190s with new onset A.Fib RVR of which she has no history (is currently on coumadin for history of DVT), and her BP was 60s systolic.  Because she is DNR she was not cardioverted, but metoprolol  IV was attempted by the EDP in an attempt to slow her down in hopes of achieving hemodynamic instability.  This worked and patient has now converted out of A.Fib into NSR in the 90s.  After 2 L IVF her BP was in the 110s and has trended slowly back down to the 90s systolic.  Review of Systems: Unable to perform due to AMS  Past Medical History  Diagnosis Date  . HTN (hypertension)   . DVT (deep venous thrombosis) (HCC)    No past surgical history on file. Social History:  reports that she has never smoked. She does not have any smokeless tobacco history on file. She reports that she does not drink alcohol or use illicit drugs.  No Known Allergies  Family History  Problem Relation Age of Onset  . CAD    . Cancer Neg Hx      Prior to Admission medications   Medication Sig Start Date End Date Taking? Authorizing Provider  albuterol (PROVENTIL) (2.5 MG/3ML) 0.083% nebulizer solution Take 2.5 mg by nebulization every 6 (six) hours as needed for wheezing or shortness of breath.   Yes Historical Provider, MD  albuterol (PROVENTIL) (2.5 MG/3ML) 0.083% nebulizer solution Take 2.5 mg by nebulization 3 (three) times daily.   Yes Historical Provider, MD  buPROPion (WELLBUTRIN XL) 150 MG 24 hr tablet Take 300 mg by mouth daily.   Yes Historical Provider, MD   chlorpheniramine (CHLOR-TRIMETON) 4 MG tablet Take 4 mg by mouth every 4 (four) hours as needed for allergies.   Yes Historical Provider, MD  divalproex (DEPAKOTE) 125 MG DR tablet Take 250 mg by mouth at bedtime.   Yes Historical Provider, MD  docusate sodium (COLACE) 100 MG capsule Take 100 mg by mouth 2 (two) times daily.   Yes Historical Provider, MD  donepezil (ARICEPT) 10 MG tablet Take 10 mg by mouth at bedtime.   Yes Historical Provider, MD  guaiFENesin (ROBITUSSIN) 100 MG/5ML SOLN Take 15 mLs by mouth at bedtime as needed for cough or to loosen phlegm.   Yes Historical Provider, MD  Influenza vac split quadrivalent PF (AFLURIA QUADRIVALENT) 0.5 ML injection Inject 0.5 mLs into the muscle once.   Yes Historical Provider, MD  levofloxacin (LEVAQUIN) 500 MG tablet Take 500 mg by mouth daily.   Yes Historical Provider, MD  levothyroxine (SYNTHROID, LEVOTHROID) 100 MCG tablet Take 100 mcg by mouth daily before breakfast.   Yes Historical Provider, MD  lidocaine (LIDODERM) 5 % Place 0.5 patches onto the skin daily. To right knee and Left shoulder   Yes Historical Provider, MD  LORazepam (ATIVAN) 0.5 MG tablet Take 0.25 mg by mouth every 6 (six) hours as needed for anxiety.   Yes Historical Provider, MD  metoprolol tartrate (LOPRESSOR) 25 MG tablet Take 12.5 mg by mouth 2 (two)  times daily.   Yes Historical Provider, MD  polyethylene glycol (MIRALAX / GLYCOLAX) packet Take 17 g by mouth daily.   Yes Historical Provider, MD  predniSONE (DELTASONE) 20 MG tablet Take 40 mg by mouth daily with breakfast.   Yes Historical Provider, MD  senna (SENOKOT) 8.6 MG TABS tablet Take 1 tablet by mouth daily.   Yes Historical Provider, MD  sodium chloride (OCEAN) 0.65 % SOLN nasal spray Place 1 spray into both nostrils as needed for congestion.   Yes Historical Provider, MD  traMADol-acetaminophen (ULTRACET) 37.5-325 MG tablet Take 1 tablet by mouth every 6 (six) hours as needed for moderate pain.   Yes  Historical Provider, MD  UNABLE TO FIND Take 60 mLs by mouth 2 (two) times daily. Med Name: Medpass   Yes Historical Provider, MD   Physical Exam: Filed Vitals:   09/10/15 0215 09/10/15 0230  BP: 93/35 94/47  Pulse: 87 90  Temp:    Resp: 20 19    BP 94/47 mmHg  Pulse 90  Temp(Src) 99.5 F (37.5 C) (Rectal)  Resp 19  SpO2 93%  General Appearance:    Resting but is altered when woken up,  appears stated age  Head:    Normocephalic, atraumatic  Eyes:    PERRL, EOMI, sclera non-icteric        Nose:   Nares without drainage or epistaxis. Mucosa, turbinates normal  Throat:   Moist mucous membranes. Oropharynx without erythema or exudate.  Neck:   Supple. No carotid bruits.  No thyromegaly.  No lymphadenopathy.   Back:     No CVA tenderness, no spinal tenderness  Lungs:     Coarse breath sounds  Chest wall:    No tenderness to palpitation  Heart:    Regular rate and rhythm without murmurs, gallops, rubs  Abdomen:     Soft, non-tender, nondistended, normal bowel sounds, no organomegaly  Genitalia:    deferred  Rectal:    deferred  Extremities:   No clubbing, cyanosis or edema.  Pulses:   2+ and symmetric all extremities  Skin:   Skin color, texture, turgor normal, no rashes or lesions  Lymph nodes:   Cervical, supraclavicular, and axillary nodes normal  Neurologic:   MAE, grossly non-focal though patient not participating in exam.    Labs on Admission:  Basic Metabolic Panel:  Recent Labs Lab 09/10/15 0039  NA 134*  K 5.5*  CL 104  CO2 19*  GLUCOSE 138*  BUN 90*  CREATININE 1.90*  CALCIUM 8.0*   Liver Function Tests:  Recent Labs Lab 09/10/15 0039  AST 24  ALT 12*  ALKPHOS 51  BILITOT 0.5  PROT 5.0*  ALBUMIN 2.5*   No results for input(s): LIPASE, AMYLASE in the last 168 hours. No results for input(s): AMMONIA in the last 168 hours. CBC:  Recent Labs Lab 09/10/15 0039  WBC 15.8*  NEUTROABS 12.9*  HGB 8.8*  HCT 27.2*  MCV 86.3  PLT 363   Cardiac  Enzymes: No results for input(s): CKTOTAL, CKMB, CKMBINDEX, TROPONINI in the last 168 hours.  BNP (last 3 results) No results for input(s): PROBNP in the last 8760 hours. CBG: No results for input(s): GLUCAP in the last 168 hours.  Radiological Exams on Admission: Dg Chest Port 1 View  09/10/2015  CLINICAL DATA:  Dyspnea, weakness, abdominal pain EXAM: PORTABLE CHEST 1 VIEW COMPARISON:  None. FINDINGS: Opacity in the lateral left base may represent atelectasis or infectious infiltrate. No large effusions. Right lung  is clear. Normal pulmonary vasculature. IMPRESSION: Lateral left base opacity, likely atelectasis or infectious infiltrate. Electronically Signed   By: Ellery Plunkaniel R Mitchell M.D.   On: 09/10/2015 00:24    EKG: Independently reviewed.  Assessment/Plan Active Problems:   Severe sepsis with acute organ dysfunction (HCC)   HCAP (healthcare-associated pneumonia)   Acute kidney failure (HCC)   Atrial fibrillation with RVR (HCC)   Warfarin-induced coagulopathy (HCC)   1. Severe sepsis with acute organ dysfunction due to left lower lobe PNA - will treat as HCAP as she has failed levaquin 1. PNA pathway 2. Tylenol PRN fever 3. fortaz and vanc 4. IVF - 2L in ED, giving 3rd bolus now due to BP trending back down, NS at 125 cc/hr maint ordered 2. A.Fib RVR - patient converted out of this after 5mg  metoprolol x1, plan to try repeating this if she goes back in to A.Fib with a very fast rate again. 3. AKF - repeat BMP in AM 4. Coumadin Coagulopathy - INR > 10; getting PO vit K, no obvious bleed at this point and while patient is altered, neuro exam dosent appear to be focal.  CT head is ordered, but doubt she will hold still for this at the moment.    Code Status: DNR/DNI  Family Communication: Family at bedside Disposition Plan: Admit to inpatient   Time spent: 70 min  GARDNER, JARED M. [facility] Pager [phone]  If 7AM-7PM, please contact the day team taking care of  the patient [email or web address] Password [email or web address] 09/10/2015, 2:46 AM

## 2015-09-10 NOTE — Progress Notes (Signed)
ANTIBIOTIC CONSULT NOTE - INITIAL  Pharmacy Consult for Ceftazidime and Vancomycin Indication: HCAP  No Known Allergies  Patient Measurements:    Estimated wt: 180 lb (81 kg)  Vital Signs: Temp: 99.5 F (37.5 C) (12/01 0059) Temp Source: Rectal (12/01 0059) BP: 89/47 mmHg (12/01 0300) Pulse Rate: 108 (12/01 0300) Intake/Output from previous day:   Intake/Output from this shift:    Labs:  Recent Labs  09/10/15 0039  WBC 15.8*  HGB 8.8*  PLT 363  CREATININE 1.90*   CrCl cannot be calculated (Unknown ideal weight.). No results for input(s): VANCOTROUGH, VANCOPEAK, VANCORANDOM, GENTTROUGH, GENTPEAK, GENTRANDOM, TOBRATROUGH, TOBRAPEAK, TOBRARND, AMIKACINPEAK, AMIKACINTROU, AMIKACIN in the last 72 hours.   Microbiology: No results found for this or any previous visit (from the past 720 hour(s)).  Medical History: Past Medical History  Diagnosis Date  . HTN (hypertension)   . DVT (deep venous thrombosis) (HCC)     Medications:  See electronic med rec  Assessment: 81 y.o. F presents from NH with AMS. Pt with recent PNA diagnosis and being treated with Levaquin PTA. Pt received Ceftazidime 2gm and Vancomycin 1gm ~0200 in ED. To continue Ceftazidime and Vancomycin for HCAP. LA 3.19. Afeb. WBC elevated to 15.8. SCr 1.9, est CrCl 22 ml/min.  Goal of Therapy:  Vancomycin trough level 15-20 mcg/ml  Plan:  Ceftazidime 2 gm IV q24h Vancomycin 1 gm IV q24h  Will f/u micro data, renal function, and pt's clinical condition Vanc trough prn  Christoper Fabianaron Capria Cartaya, PharmD, BCPS Clinical pharmacist, pager [phone] 09/10/2015,3:21 AM

## 2015-09-10 NOTE — Progress Notes (Signed)
   09/10/15 0425  Clinical Encounter Type  Visited With Patient and family together;Health care provider  Visit Type Initial;Psychological support;Spiritual support;ED  Referral From Nurse  Spiritual Encounters  Spiritual Needs Sacred text;Prayer;Ritual;Emotional  Stress Factors  Patient Stress Factors Health changes;Lack of knowledge  Family Stress Factors Family relationships;Health changes;Loss of control   Chaplain responded to a page for emotional and spiritual support for pt and daughter.  Pt has dementia and is calling out for "God to help me."  Chaplain provided empathetic listening to pt and daughter.  Sacred texts, rituals, and prayer were also offered to bring comfort. Pt is emotionally distressed and daughter is worried about her mother's suffering and pain level and about how quickly her mother's health deteriorated in the last 12-18 hors.  Chaplain offered hospitality to daughter who was beginning to feel anxious.  [employer]ia Chieko Neises, [address]

## 2015-09-10 NOTE — ED Notes (Signed)
Patient received to POD C.   Per report from Chrislyn, RN, patient is comfort care only.  Patient is not to be placed back on leads or have vitals at this time.   Patient gets very agitated when staff tries to get vital signs.   Patient currently asleep.

## 2015-09-10 NOTE — Progress Notes (Addendum)
K-Centra ordered for rapid emergent INR correction.  2 units PRBC also ordered.  Another 5mg  metoprolol ordered because she is now back in A.fib RVR with rate in the 150s and this worked very well last time.  Had discussion with daughter at bedside who agrees with medical therapy, but she also agrees that intubation would not be reasonable and understands that we likely cannot do emergent EGD at this time as a result.

## 2015-09-10 NOTE — ED Notes (Signed)
Patient arrived via [facility]. EMS reports that patient is a resident at [facility]. Patient last seen normal at 1900 by staff. Staff reported to EMS that patient has recently been treated with antibiotics for PNA. C/o L shoulder pain and nausea. EMS reports that patient found to be cool, clammy, diaphoretic upon their arrival. Extremities cool. BP 60/40, Pulse 180's. No IV access. Zofran 4 mg IM administered. BP prior to arrival at ED - 85/62, Pulse 160. CBG 137.

## 2015-09-10 NOTE — ED Notes (Signed)
Kirke Corinamela Iseley (daughter) [phone] (home); [phone] (cell)

## 2015-09-11 DIAGNOSIS — T45511A Poisoning by anticoagulants, accidental (unintentional), initial encounter: Secondary | ICD-10-CM

## 2015-09-11 DIAGNOSIS — J189 Pneumonia, unspecified organism: Secondary | ICD-10-CM

## 2015-09-11 DIAGNOSIS — A419 Sepsis, unspecified organism: Principal | ICD-10-CM

## 2015-09-11 DIAGNOSIS — K922 Gastrointestinal hemorrhage, unspecified: Secondary | ICD-10-CM

## 2015-09-11 DIAGNOSIS — I4891 Unspecified atrial fibrillation: Secondary | ICD-10-CM

## 2015-09-11 DIAGNOSIS — N179 Acute kidney failure, unspecified: Secondary | ICD-10-CM

## 2015-09-11 DIAGNOSIS — R0600 Dyspnea, unspecified: Secondary | ICD-10-CM | POA: Insufficient documentation

## 2015-09-11 DIAGNOSIS — R6520 Severe sepsis without septic shock: Secondary | ICD-10-CM

## 2015-09-11 DIAGNOSIS — D689 Coagulation defect, unspecified: Secondary | ICD-10-CM

## 2015-09-11 MED ORDER — POLYVINYL ALCOHOL 1.4 % OP SOLN: 1.0000 [drp] | Freq: Four times a day (QID) | OPHTHALMIC | Status: AC | PRN

## 2015-09-11 MED ORDER — MORPHINE SULFATE (CONCENTRATE) 10 MG/0.5ML PO SOLN
5.0000 mg | ORAL | Status: DC | PRN
  Administered 2015-09-11: 10 mg via ORAL
  Filled 2015-09-11: qty 0.5

## 2015-09-11 MED ORDER — LORAZEPAM 2 MG/ML PO CONC: 1.0000 mg | ORAL | Status: AC | PRN

## 2015-09-11 MED ORDER — WHITE PETROLATUM GEL
Status: AC
  Administered 2015-09-11: 11:00:00
  Filled 2015-09-11: qty 1

## 2015-09-11 MED ORDER — MORPHINE SULFATE (PF) 2 MG/ML IV SOLN
2.0000 mg | INTRAVENOUS | Status: DC | PRN
  Administered 2015-09-11: 2 mg via INTRAVENOUS
  Filled 2015-09-11 (×2): qty 1

## 2015-09-11 MED ORDER — GLYCOPYRROLATE 0.2 MG/ML IJ SOLN
0.2000 mg | INTRAMUSCULAR | Status: DC
  Filled 2015-09-11 (×5): qty 1

## 2015-09-11 MED ORDER — GLYCOPYRROLATE 0.2 MG/ML IJ SOLN
0.2000 mg | INTRAMUSCULAR | Status: DC
  Administered 2015-09-11: 0.2 mg via INTRAVENOUS
  Filled 2015-09-11 (×7): qty 1

## 2015-09-11 MED ORDER — MORPHINE SULFATE (CONCENTRATE) 10 MG/0.5ML PO SOLN: 5.0000 mg | ORAL | Status: AC | PRN

## 2015-09-11 MED ORDER — MORPHINE SULFATE (PF) 2 MG/ML IV SOLN: 2.0000 mg | INTRAVENOUS | Status: DC | PRN

## 2015-09-11 MED ORDER — ATROPINE SULFATE 1 % OP SOLN: 4.0000 [drp] | OPHTHALMIC | Status: AC | PRN

## 2015-09-11 NOTE — Discharge Summary (Signed)
Physician Discharge Summary  Meridee ScoreJean Elks ZOX:[id number] DOB: 1923/02/16 DOA: 09/09/2015  PCP: Ginette OttoSTONEKING,HAL THOMAS, MD  Admit date: 09/09/2015 Discharge date: 09/11/2015  Time spent:   Less than 30 minutes  Recommendations for Outpatient Follow-up:  1. M.D. at SNF, as needed for assistance with continued end of life comfort care.  Discharge Diagnoses:  Active Problems:   Severe sepsis with acute organ dysfunction (HCC)   HCAP (healthcare-associated pneumonia)   Acute kidney failure (HCC)   Atrial fibrillation with RVR (HCC)   Warfarin-induced coagulopathy (HCC)   Severe sepsis (HCC)   Palliative care encounter   Dyspnea   Discharge Condition: Improved & Stable  Diet recommendation: Comfort feeds of choice.  Filed Weights   09/10/15 0326 09/11/15 0557  Weight: 81.647 kg (180 lb) 82.3 kg (181 lb 7 oz)    History of present illness & Hospital course:  81 year old female patient, a resident of current SNF for the last 8-1/2 years, wheelchair bound for the last 8 years, severe dementia with behavioral problems at baseline, HTN, DVT, hypothyroid, [facility]15 with altered mental status. She had recently been diagnosed with pneumonia at the nursing home and was being treated with levofloxacin. She was evaluated by the admitting M.D. and diagnosed with severe sepsis with acute organ dysfunction due to left lower lobe pneumonia/healthcare associated pneumonia, newly diagnosed A. fib with RVR, acute kidney injury and Coumadin coagulopathy with INR >10. DO NOT RESUSCITATE/DO NOT INTUBATE was established but patient was initially started on aggressive care including sepsis protocol-IV fluids, broad-spectrum IV antibiotics, beta blockers for A. fib and anticoagulation was reversed with vitamin K and KCentra. Patient subsequently started having acute upper GI bleed and hematemesis and acute blood loss anemia. I subsequently saw the patient in the ED and had extensive  discussion with patient's daughter/healthcare power of attorney Mrs. Marnette BurgessPamela Isley at bedside. Patient at this time seem to be suffering, was in pain and was asking to die!. I discussed options of continued aggressive medical care versus full comfort care. Explained that her overall prognosis was extremely poor especially given her advanced age, multiple acute medical problems and poor baseline quality of life. Daughter opted for complete comfort care. All aggressive interventions were discontinued. Medications to support comfort with initiated. On reassessment today, patient is lying comfortably in bed. As per granddaughter at bedside, no further emesis. Pain seems to be controlled. No dyspnea noted. She does intermittently wake up and mental status seems to be at baseline. Oral intake has decreased. After extensive discussion with Ms. Marnette Burgessamela Isley and patient's granddaughter, they are eager for patient to return to her room at [facility] (her room apparently has been customized for her) for end-of-life care. She does not appear to be imminently dying at this particular time but her prognosis may be days. Palliative care also assisted with management.    Discharge Exam:  Complaints:  Patient is sleeping. Did not arouse her. No further vomiting. Oral intake reduced. Intermittently wakes up and mental status at baseline as per granddaughter.   Filed Vitals:   09/10/15 0815 09/10/15 0937 09/10/15 1144 09/11/15 0557  BP:  101/52 132/54 74/43  Pulse: 80 70 70 101  Temp:   98.6 F (37 C) 97.5 F (36.4 C)  TempSrc:   Oral Oral  Resp: 17 24 20 24   Weight:    82.3 kg (181 lb 7 oz)  SpO2: 100% 97% 89% 100%    General exam: Pleasant elderly female lying comfortably propped  up in bed without distress. Mouth breathing. Sleeping. Did not arouse her. Did not perform detailed physical exam to avoid causing her to wake up and be in discomfort.   Discharge Instructions      Discharge Instructions     Call MD for:  difficulty breathing, headache or visual disturbances    Complete by:  As directed      Call MD for:  persistant nausea and vomiting    Complete by:  As directed      Call MD for:  severe uncontrolled pain    Complete by:  As directed      Call MD for:  temperature >100.4    Complete by:  As directed      Diet general    Complete by:  As directed      Discharge instructions    Complete by:  As directed   Comfort feeds of choice.     Increase activity slowly    Complete by:  As directed             Medication List    STOP taking these medications        AFLURIA QUADRIVALENT 0.5 ML injection  Generic drug:  Influenza vac split quadrivalent PF     buPROPion 150 MG 24 hr tablet  Commonly known as:  WELLBUTRIN XL     chlorpheniramine 4 MG tablet  Commonly known as:  CHLOR-TRIMETON     divalproex 125 MG DR tablet  Commonly known as:  DEPAKOTE     docusate sodium 100 MG capsule  Commonly known as:  COLACE     donepezil 10 MG tablet  Commonly known as:  ARICEPT     guaiFENesin 100 MG/5ML Soln  Commonly known as:  ROBITUSSIN     levofloxacin 500 MG tablet  Commonly known as:  LEVAQUIN     levothyroxine 100 MCG tablet  Commonly known as:  SYNTHROID, LEVOTHROID     lidocaine 5 %  Commonly known as:  LIDODERM     LORazepam 0.5 MG tablet  Commonly known as:  ATIVAN  Replaced by:  LORazepam 2 MG/ML concentrated solution     metoprolol tartrate 25 MG tablet  Commonly known as:  LOPRESSOR     polyethylene glycol packet  Commonly known as:  MIRALAX / GLYCOLAX     predniSONE 20 MG tablet  Commonly known as:  DELTASONE     senna 8.6 MG Tabs tablet  Commonly known as:  SENOKOT     sodium chloride 0.65 % Soln nasal spray  Commonly known as:  OCEAN     traMADol-acetaminophen 37.5-325 MG tablet  Commonly known as:  ULTRACET     UNABLE TO FIND      TAKE these medications        albuterol (2.5 MG/3ML) 0.083% nebulizer solution  Commonly known as:   PROVENTIL  Take 2.5 mg by nebulization every 6 (six) hours as needed for wheezing or shortness of breath.     albuterol (2.5 MG/3ML) 0.083% nebulizer solution  Commonly known as:  PROVENTIL  Take 2.5 mg by nebulization 3 (three) times daily.     atropine 1 % ophthalmic solution  Place 4 drops under the tongue every 4 (four) hours as needed (Upper airway secretions or gurgling.).     LORazepam 2 MG/ML concentrated solution  Commonly known as:  ATIVAN  Place 0.5 mLs (1 mg total) under the tongue every 4 (four) hours as needed for anxiety.  morphine CONCENTRATE 10 MG/0.5ML Soln concentrated solution  Place 0.25-0.5 mLs (5-10 mg total) under the tongue every 2 (two) hours as needed for moderate pain, severe pain or shortness of breath (dyspnea, RR > 25).     polyvinyl alcohol 1.4 % ophthalmic solution  Commonly known as:  LIQUIFILM TEARS  Place 1 drop into both eyes 4 (four) times daily as needed for dry eyes.          The results of significant diagnostics from this hospitalization (including imaging, microbiology, ancillary and laboratory) are listed below for reference.    Significant Diagnostic Studies: Ct Head Wo Contrast  09/10/2015  CLINICAL DATA:  Altered mental status.  Hypercoagulable. EXAM: CT HEAD WITHOUT CONTRAST TECHNIQUE: Contiguous axial images were obtained from the base of the skull through the vertex without intravenous contrast. COMPARISON:  None. FINDINGS: There is no intracranial hemorrhage, mass or evidence of acute infarction. There is no extra-axial fluid collection. There is moderately severe generalized atrophy. There is periventricular hypodensity which may represent chronic small vessel ischemic disease. There is remote lacunar infarction in the left subinsular region. No bony abnormality. Visible paranasal sinuses are clear. IMPRESSION: No acute intracranial findings. There is moderate generalized atrophy, chronic small vessel disease and remote lacunar  infarction. Electronically Signed   By: Ellery Plunk M.D.   On: 09/10/2015 06:21   Dg Chest Port 1 View  09/10/2015  CLINICAL DATA:  Dyspnea, weakness, abdominal pain EXAM: PORTABLE CHEST 1 VIEW COMPARISON:  None. FINDINGS: Opacity in the lateral left base may represent atelectasis or infectious infiltrate. No large effusions. Right lung is clear. Normal pulmonary vasculature. IMPRESSION: Lateral left base opacity, likely atelectasis or infectious infiltrate. Electronically Signed   By: Ellery Plunk M.D.   On: 09/10/2015 00:24    Microbiology: No results found for this or any previous visit (from the past 240 hour(s)).   Labs: Basic Metabolic Panel:  Recent Labs Lab 09/10/15 0039 09/10/15 0651  NA 134* 135  K 5.5* 4.4  CL 104 108  CO2 19* 16*  GLUCOSE 138* 104*  BUN 90* 84*  CREATININE 1.90* 1.61*  CALCIUM 8.0* 7.1*   Liver Function Tests:  Recent Labs Lab 09/10/15 0039  AST 24  ALT 12*  ALKPHOS 51  BILITOT 0.5  PROT 5.0*  ALBUMIN 2.5*   No results for input(s): LIPASE, AMYLASE in the last 168 hours. No results for input(s): AMMONIA in the last 168 hours. CBC:  Recent Labs Lab 09/10/15 0039 09/10/15 0651  WBC 15.8* 19.6*  NEUTROABS 12.9*  --   HGB 8.8* 6.7*  HCT 27.2* 20.9*  MCV 86.3 86.7  PLT 363 276   Cardiac Enzymes:  Recent Labs Lab 09/10/15 0039 09/10/15 0651  TROPONINI 0.60* 0.37*   BNP: BNP (last 3 results) No results for input(s): BNP in the last 8760 hours.  ProBNP (last 3 results) No results for input(s): PROBNP in the last 8760 hours.  CBG: No results for input(s): GLUCAP in the last 168 hours.      Signed:  Marcellus Scott, MD, FACP, FHM. [facility] Pager [phone]  If 7PM-7AM, please contact night-coverage [email or web address] Password [email or web address] 09/11/2015, 12:16 PM

## 2015-09-11 NOTE — Care Management Note (Signed)
Case Management Note  Patient Details  Name: Pam Wheeler MRN: [id number] Date of Birth: 1923-01-09  Subjective/Objective:    81 y.o. F who is to return [facility] today at the request of her family. Pt is actively dying and pts family would like this to happen at her long time residence surrounded by her family. CM spoke with facility who uses [facility] to provide end of life services to their residents.  [facility] to speak with family member in room, ForganMichelle.                Action/Plan: Discharge to SNF today.    Expected Discharge Date:                  Expected Discharge Plan:  Skilled Nursing Facility  In-House Referral:  Hospice / Palliative Care  Discharge planning Services  CM Consult  Post Acute Care Choice:    Choice offered to:   ([facility])  DME Arranged:    DME Agency:     HH Arranged:    HH Agency:  [facility]  Status of Service:  Completed, signed off  Medicare Important Message Given:    Date Medicare IM Given:    Medicare IM give by:    Date Additional Medicare IM Given:    Additional Medicare Important Message give by:     If discussed at Long Length of Stay Meetings, dates discussed:    Additional Comments:  Pam Wheeler, Pam Delmore M, RN 09/11/2015, 10:57 AM

## 2015-09-11 NOTE — Progress Notes (Signed)
Patient discharging back [facility] today. Report [facility] to [facility] at [facility]. Patient was given pain medicine prior to discharge. Patient is comfortable with family at bedside. Patient discharged by ambulance.

## 2015-09-11 NOTE — [place] FL2 (Signed)
[place] MEDICAID FL2 LEVEL OF CARE SCREENING TOOL     IDENTIFICATION  Patient Name: Pam ScoreJean Bouffard Birthdate: July 23, 1923 Sex: female Admission Date (Current Location): 09/09/2015  [facility] [place]   Facility and Address:  [facility] [place], [address], [place], [place] [address]      Provider Number: [id number]  Attending Physician Name and Address:  Elease EtienneAnand D Hongalgi, MD  Relative Name and Phone Number:  Tana Coastam Isley - daughter.  Phone number [phone]    Current Level of Care: Hospital Recommended Level of Care: Nursing Facility Prior Approval Number:    Date Approved/Denied:   PASRR Number:    Discharge Plan: SNF    Current Diagnoses: Patient Active Problem List   Diagnosis Date Noted  . Dyspnea   . Severe sepsis with acute organ dysfunction (HCC) 09/10/2015  . HCAP (healthcare-associated pneumonia) 09/10/2015  . Acute kidney failure (HCC) 09/10/2015  . Atrial fibrillation with RVR (HCC) 09/10/2015  . Warfarin-induced coagulopathy (HCC) 09/10/2015  . Severe sepsis (HCC) 09/10/2015  . Palliative care encounter     Orientation ACTIVITIES/SOCIAL BLADDER RESPIRATION    Self  Passive Incontinent Other (Comment) (Expiratory wheezes and upper breath sounds dimished)  BEHAVIORAL SYMPTOMS/MOOD NEUROLOGICAL BOWEL NUTRITION STATUS      Incontinent Diet (Regular)  PHYSICIAN VISITS COMMUNICATION OF NEEDS Height & Weight Skin    Verbally   181 lbs. Normal          AMBULATORY STATUS RESPIRATION    Assist extensive Other (Comment) (Expiratory wheezes and upper breath sounds dimished)      Personal Care Assistance Level of Assistance  Total care       Total Care Assistance: Maximum assistance    Functional Limitations Info                SPECIAL CARE FACTORS FREQUENCY                      Additional Factors Info  Code Status, Allergies Code Status Info: DNR Allergies Info: No known allergies            Current Medications (09/11/2015):  This is the current hospital active medication list Current Facility-Administered Medications  Medication Dose Route Frequency Provider Last Rate Last Dose  . 0.9 %  sodium chloride infusion   Intravenous Continuous Elease EtienneAnand D Hongalgi, MD 10 mL/hr at 09/10/15 0845    . albuterol (PROVENTIL) (2.5 MG/3ML) 0.083% nebulizer solution 2.5 mg  2.5 mg Nebulization Q4H PRN Ulice BoldAlicia C Parker, NP      . antiseptic oral rinse (BIOTENE) solution 15 mL  15 mL Topical PRN Ulice BoldAlicia C Parker, NP      . antiseptic oral rinse (CPC / CETYLPYRIDINIUM CHLORIDE 0.05%) solution 7 mL  7 mL Mouth Rinse q12n4p Elease EtienneAnand D Hongalgi, MD   7 mL at 09/10/15 1824  . chlorhexidine (PERIDEX) 0.12 % solution 15 mL  15 mL Mouth Rinse BID Elease EtienneAnand D Hongalgi, MD   15 mL at 09/10/15 2254  . glycopyrrolate (ROBINUL) injection 0.2 mg  0.2 mg Intravenous 6 times per day Elease EtienneAnand D Hongalgi, MD      . LORazepam (ATIVAN) injection 1 mg  1 mg Intravenous Q4H PRN Elease EtienneAnand D Hongalgi, MD   1 mg at 09/11/15 0358  . morphine CONCENTRATE 10 MG/0.5ML oral solution 5-10 mg  5-10 mg Oral Q1H PRN Ulice BoldAlicia C Parker, NP       Or  . morphine 2 MG/ML injection 2 mg  2 mg Intravenous Q1H PRN Ulice Bold, NP   2 mg at 09/11/15 1118  . ondansetron (ZOFRAN) injection 4 mg  4 mg Intravenous Q6H PRN Elease Etienne, MD   4 mg at 09/11/15 0355  . polyvinyl alcohol (LIQUIFILM TEARS) 1.4 % ophthalmic solution 1 drop  1 drop Both Eyes QID PRN Ulice Bold, NP         Discharge Medications: Please see discharge summary for a list of discharge medications.  Relevant Imaging Results:  Relevant Lab Results:  Recent Labs    Additional Information Hospice services have been arranged through [facility] [place], [place], [place]

## 2015-09-11 NOTE — Clinical Social Work Note (Signed)
[facility].  Discharge information transmitted to facility and patient will be transported by ambulance.  Daughter at the bedside and is aware of today's discharge.   Genelle BalVanessa Tabithia Stroder, MSW, LCSW Licensed Clinical Social Worker Clinical Social Work Department [facility] [phone]

## 2015-09-11 NOTE — Progress Notes (Addendum)
Daily Progress Note   Patient Name: Meridee ScoreJean Trompeter       Date: 09/11/2015 DOB: May 15, 1923  Age: 81 y.o. MRN#: [id number] Attending Physician: Elease EtienneAnand D Hongalgi, MD Primary Care Physician: No primary care provider on file. Admit Date: 09/09/2015  Reason for Consultation/Follow-up: Establishing goals of care  Subjective: Ms. Scarlett Prestoharp is lying in bed. Granddaughter, Marcelino DusterMichelle, is at bedside. Ms. Scarlett Prestoharp has oxygen but it is around her chin - discussed with Marcelino DusterMichelle - will d/c for comfort and utilize medication for any respiratory distress as they do not want any prolonging measures and the oxygen is unlikely to provide much comfort to her as she was agitated by this when attempted yesterday. Congestion and productive cough - will schedule robinul. Hypotensive today and Marcelino DusterMichelle notes that she cannot take water through straw anymore. Discussed signs of progression with Marcelino DusterMichelle. With full comfort care onboard prognosis poor at hours to a couple days.    Length of Stay: 1 day  Current Medications: Scheduled Meds:  . antiseptic oral rinse  7 mL Mouth Rinse q12n4p  . chlorhexidine  15 mL Mouth Rinse BID  . glycopyrrolate  0.2 mg Intravenous 6 times per day  . white petrolatum        Continuous Infusions: . sodium chloride 10 mL/hr at 09/10/15 0845    PRN Meds: albuterol, antiseptic oral rinse, LORazepam, morphine injection, ondansetron (ZOFRAN) IV, polyvinyl alcohol  Physical Exam: Physical Exam  Constitutional: She appears well-developed. She has a sickly appearance.  Pale.   Cardiovascular: Tachycardia present.   Pulmonary/Chest: Effort normal. No accessory muscle usage. No tachypnea. No respiratory distress.  Congested. Shallow breathing. Regular rate.   Abdominal: Soft. Normal appearance.    Neurological: She is disoriented.  Arousing at times.                 Vital Signs: BP 74/43 mmHg  Pulse 101  Temp(Src) 97.5 F (36.4 C) (Oral)  Resp 24  Wt 82.3 kg (181 lb 7 oz)  SpO2 100% SpO2: SpO2: 100 % O2 Device: O2 Device: Nasal Cannula O2 Flow Rate:    Intake/output summary:  Intake/Output Summary (Last 24 hours) at 09/11/15 0934 Last data filed at 09/10/15 1850  Gross per 24 hour  Intake 160.83 ml  Output      0 ml  Net 160.83  ml   LBM: Last BM Date: 09/10/15 Baseline Weight: Weight: 81.647 kg (180 lb) Most recent weight: Weight: 82.3 kg (181 lb 7 oz)       Palliative Assessment/Data: Flowsheet Rows        Most Recent Value   Intake Tab    Referral Department  Hospitalist   Unit at Time of Referral  ER   Palliative Care Primary Diagnosis  Sepsis/Infectious Disease   Date Notified  09/10/15   Palliative Care Type  New Palliative care   Reason for referral  End of Life Care Assistance   Date of Admission  09/09/15   # of days IP prior to Palliative referral  1   Clinical Assessment    Psychosocial & Spiritual Assessment    Palliative Care Outcomes       Additional Data Reviewed: CBC    Component Value Date/Time   WBC 19.6* 09/10/2015 0651   RBC 2.41* 09/10/2015 0651   HGB 6.7* 09/10/2015 0651   HCT 20.9* 09/10/2015 0651   PLT 276 09/10/2015 0651   MCV 86.7 09/10/2015 0651   MCH 27.8 09/10/2015 0651   MCHC 32.1 09/10/2015 0651   RDW 15.2 09/10/2015 0651   LYMPHSABS 1.8 09/10/2015 0039   MONOABS 1.2* 09/10/2015 0039   EOSABS 0.0 09/10/2015 0039   BASOSABS 0.0 09/10/2015 0039    CMP     Component Value Date/Time   NA 135 09/10/2015 0651   K 4.4 09/10/2015 0651   CL 108 09/10/2015 0651   CO2 16* 09/10/2015 0651   GLUCOSE 104* 09/10/2015 0651   BUN 84* 09/10/2015 0651   CREATININE 1.61* 09/10/2015 0651   CALCIUM 7.1* 09/10/2015 0651   PROT 5.0* 09/10/2015 0039   ALBUMIN 2.5* 09/10/2015 0039   AST 24 09/10/2015 0039   ALT 12* 09/10/2015  0039   ALKPHOS 51 09/10/2015 0039   BILITOT 0.5 09/10/2015 0039   GFRNONAA 27* 09/10/2015 0651   GFRAA 31* 09/10/2015 0651       Problem List:  Patient Active Problem List   Diagnosis Date Noted  . Severe sepsis with acute organ dysfunction (HCC) 09/10/2015  . HCAP (healthcare-associated pneumonia) 09/10/2015  . Acute kidney failure (HCC) 09/10/2015  . Atrial fibrillation with RVR (HCC) 09/10/2015  . Warfarin-induced coagulopathy (HCC) 09/10/2015  . Severe sepsis (HCC) 09/10/2015  . Palliative care encounter      Palliative Care Assessment & Plan    1.Code Status:  DNR    Code Status Orders        Start     Ordered   09/10/15 1552  Do not attempt resuscitation (DNR)   Continuous    Question Answer Comment  In the event of cardiac or respiratory ARREST Do not call a "code blue"   In the event of cardiac or respiratory ARREST Do not perform Intubation, CPR, defibrillation or ACLS   In the event of cardiac or respiratory ARREST Use medication by any route, position, wound care, and other measures to relive pain and suffering. May use oxygen, suction and manual treatment of airway obstruction as needed for comfort.      09/10/15 1552    Advance Directive Documentation        Most Recent Value   Type of Advance Directive  -- [DNR]   Pre-existing out of facility DNR order (yellow form or pink MOST form)     "MOST" Form in Place?         2. Goals of Care/Additional Recommendations:  Full comfort care.   Limitations on Scope of Treatment: Full Comfort Care  Desire for further Chaplaincy support:no  Psycho-social Needs: Caregiving  Support/Resources  3. Symptom Management:  Pain: Morphine 2 mg every 1 hour prn. May increase dosage/frequency as needed for comfort.   Anxiety: Lorazepam 1 mg every 4 hours prn.  Secretions: Robinul 0.2 IV every 4 hours. Oral suction to be set up at bedside.   Wheezing: Albuterol prn. May also utilize morphine for respiratory  distress.  4. Palliative Prophylaxis:   Bowel Regimen and Frequent Pain Assessment  5. Prognosis: Likely days.   6. Discharge Planning:  Family wishes for her to return to [facility] with hospice in place.     Thank you for allowing the Palliative Medicine Team to assist in the care of this patient.   Time In: 0915 Time Out: 0935 Total Time Prolonged Time Billed  no         Ulice Bold, NP  09/11/2015, 9:34 AM  Please contact Palliative Medicine Team phone at [phone] for questions and concerns.

## 2015-09-11 NOTE — Clinical Social Work Note (Signed)
Clinical Social Work Assessment  Patient Details  Name: Pam Wheeler MRN: [id number] Date of Birth: [birth year]  Date of referral:  09/10/15               Reason for consult:  Facility Placement                Permission sought to share information with:  Other (CSW talked with daughter at patient's room as patient unable to give consent) Permission granted to share information::  No  Name::     Tana CoastPam Isley  Agency::     Relationship::  Daughter  Contact Information:  [phone])  Housing/Transportation Living arrangements for the past 2 months:  [facility] Source of Information:  Adult Children, Facility Patient Interpreter Needed:  None Criminal Activity/Legal Involvement Pertinent to Current Situation/Hospitalization:  No - Comment as needed Significant Relationships:  Adult Children Lives with:  Relatives Do you feel safe going back to the place where you live?  Yes Need for family participation in patient care:  Yes (Comment)  Care giving concerns:  None expressed by daughter   Office managerocial Worker assessment / plan:  CSW talked with patient's daughter, Tana Coastam Isley and confirmed the plan is [facility]  Employment status:  Retired Engineer, miningnsurance information:  Medicare, Managed Care PT Recommendations:  Not assessed at this time Information / Referral to community resources:  Other (Comment Required) (No information needed at this time)  Patient/Family's Response to care:  No concerns expressed by daughter.  Patient/Family's Understanding of and Emotional Response to Diagnosis, Current Treatment, and Prognosis:  Not discussed. Family requested Hospice services and this was set-up by nurse case manager.   Emotional Assessment Appearance:  Other (Comment Required (Did not go in room) Attitude/Demeanor/Rapport:  Unable to Assess Affect (typically observed):  Unable to Assess Orientation:   Oriented to Self Alcohol / Substance use:  Never Used Psych involvement (Current and /or in the community):  No (Comment)  Discharge Needs  Concerns to be addressed:  No discharge needs identified Readmission within the last 30 days:  No Current discharge risk:  None Barriers to Discharge:  No Barriers Identified   Cristobal GoldmannCrawford, Walfred Bettendorf Bradley, LCSW 09/11/2015, 1:45 PM

## 2015-09-15 LAB — CULTURE, BLOOD (ROUTINE X 2)
CULTURE: NO GROWTH
Culture: NO GROWTH

## 2015-10-11 DEATH — deceased
